# Patient Record
Sex: Female | Born: 1970 | Race: Black or African American | Hispanic: No | Marital: Married | Smoking: Never smoker
Health system: Southern US, Community
[De-identification: ages and names within clinical notes are randomized; demographics above are authoritative.]

## PROBLEM LIST (undated history)

## (undated) ENCOUNTER — Ambulatory Visit: Admission: EM | Payer: Self-pay | Source: Home / Self Care

## (undated) DIAGNOSIS — R600 Localized edema: Secondary | ICD-10-CM

## (undated) DIAGNOSIS — M25569 Pain in unspecified knee: Secondary | ICD-10-CM

## (undated) DIAGNOSIS — F1120 Opioid dependence, uncomplicated: Secondary | ICD-10-CM

## (undated) DIAGNOSIS — K219 Gastro-esophageal reflux disease without esophagitis: Secondary | ICD-10-CM

## (undated) DIAGNOSIS — I10 Essential (primary) hypertension: Secondary | ICD-10-CM

## (undated) DIAGNOSIS — S8000XA Contusion of unspecified knee, initial encounter: Secondary | ICD-10-CM

## (undated) DIAGNOSIS — K8000 Calculus of gallbladder with acute cholecystitis without obstruction: Secondary | ICD-10-CM

## (undated) DIAGNOSIS — T50905A Adverse effect of unspecified drugs, medicaments and biological substances, initial encounter: Secondary | ICD-10-CM

## (undated) DIAGNOSIS — F1111 Opioid abuse, in remission: Secondary | ICD-10-CM

## (undated) DIAGNOSIS — K259 Gastric ulcer, unspecified as acute or chronic, without hemorrhage or perforation: Secondary | ICD-10-CM

## (undated) HISTORY — DX: Calculus of gallbladder with acute cholecystitis without obstruction: K80.00

## (undated) HISTORY — PX: BREAST REDUCTION SURGERY: SHX8

## (undated) HISTORY — DX: Essential (primary) hypertension: I10

## (undated) HISTORY — DX: Pain in unspecified knee: M25.569

## (undated) HISTORY — PX: REDUCTION MAMMAPLASTY: SUR839

## (undated) HISTORY — DX: Contusion of unspecified knee, initial encounter: S80.00XA

## (undated) HISTORY — DX: Localized edema: R60.0

## (undated) HISTORY — DX: Opioid abuse, in remission: F11.11

## (undated) HISTORY — PX: BREAST BIOPSY: SHX20

---

## 2007-01-15 ENCOUNTER — Ambulatory Visit: Payer: Self-pay | Admitting: Emergency Medicine

## 2007-01-22 ENCOUNTER — Ambulatory Visit: Payer: Self-pay | Admitting: Internal Medicine

## 2007-07-12 ENCOUNTER — Ambulatory Visit: Payer: Self-pay | Admitting: Internal Medicine

## 2007-12-23 ENCOUNTER — Ambulatory Visit: Payer: Self-pay | Admitting: Internal Medicine

## 2008-01-23 ENCOUNTER — Ambulatory Visit: Payer: Self-pay | Admitting: Internal Medicine

## 2008-02-24 ENCOUNTER — Emergency Department: Payer: Self-pay | Admitting: Emergency Medicine

## 2008-07-04 ENCOUNTER — Ambulatory Visit: Payer: Self-pay | Admitting: Internal Medicine

## 2008-10-26 ENCOUNTER — Ambulatory Visit: Payer: Self-pay | Admitting: Internal Medicine

## 2008-10-31 ENCOUNTER — Emergency Department: Payer: Self-pay | Admitting: Internal Medicine

## 2008-11-03 ENCOUNTER — Emergency Department: Payer: Self-pay | Admitting: Emergency Medicine

## 2008-11-04 ENCOUNTER — Ambulatory Visit: Payer: Self-pay | Admitting: Family Medicine

## 2008-11-25 ENCOUNTER — Ambulatory Visit: Payer: Self-pay | Admitting: Internal Medicine

## 2008-12-21 ENCOUNTER — Ambulatory Visit: Payer: Self-pay | Admitting: Internal Medicine

## 2009-02-13 ENCOUNTER — Ambulatory Visit: Payer: Self-pay | Admitting: Family Medicine

## 2012-10-01 ENCOUNTER — Ambulatory Visit: Payer: Self-pay | Admitting: Internal Medicine

## 2012-10-04 ENCOUNTER — Inpatient Hospital Stay: Payer: Self-pay | Admitting: Obstetrics and Gynecology

## 2012-10-04 ENCOUNTER — Ambulatory Visit: Payer: Self-pay

## 2012-10-04 LAB — COMPREHENSIVE METABOLIC PANEL
Alkaline Phosphatase: 70 U/L (ref 50–136)
BUN: 11 mg/dL (ref 7–18)
Chloride: 99 mmol/L (ref 98–107)
Co2: 25 mmol/L (ref 21–32)
Creatinine: 0.83 mg/dL (ref 0.60–1.30)
EGFR (African American): >60
Glucose: 121 mg/dL — ABNORMAL HIGH (ref 65–99)
Osmolality: 271 (ref 275–301)
Potassium: 4.1 mmol/L (ref 3.5–5.1)
SGOT(AST): 19 U/L (ref 15–37)
Sodium: 135 mmol/L — ABNORMAL LOW (ref 136–145)

## 2012-10-04 LAB — CBC WITH DIFFERENTIAL/PLATELET
Basophil #: 0.1 10*3/uL (ref 0.0–0.1)
Basophil %: 0.6 %
Eosinophil #: 0 10*3/uL (ref 0.0–0.7)
HCT: 17.2 % — ABNORMAL LOW (ref 35.0–47.0)
HGB: 4.9 g/dL — CL (ref 12.0–16.0)
Lymphocyte %: 8.5 %
MCHC: 28.4 g/dL — ABNORMAL LOW (ref 32.0–36.0)
MCV: 59 fL — ABNORMAL LOW (ref 80–100)
Monocyte #: 0.3 x10 3/mm (ref 0.2–0.9)
Monocyte %: 3.5 %
RBC: 2.9 10*6/uL — ABNORMAL LOW (ref 3.80–5.20)
RDW: 21.3 % — ABNORMAL HIGH (ref 11.5–14.5)
WBC: 9.1 10*3/uL (ref 3.6–11.0)

## 2012-10-05 LAB — DRUG SCREEN, URINE
Amphetamines, Ur Screen: NEGATIVE (ref ?–1000)
Benzodiazepine, Ur Scrn: NEGATIVE (ref ?–200)
Cannabinoid 50 Ng, Ur ~~LOC~~: NEGATIVE (ref ?–50)
MDMA (Ecstasy)Ur Screen: NEGATIVE (ref ?–500)
Methadone, Ur Screen: NEGATIVE (ref ?–300)
Opiate, Ur Screen: POSITIVE (ref ?–300)
Phencyclidine (PCP) Ur S: NEGATIVE (ref ?–25)
Tricyclic, Ur Screen: NEGATIVE (ref ?–1000)

## 2012-10-05 LAB — CBC WITH DIFFERENTIAL/PLATELET
Basophil #: 0.1 10*3/uL (ref 0.0–0.1)
Eosinophil #: 0 10*3/uL (ref 0.0–0.7)
Eosinophil #: 0.1 10*3/uL (ref 0.0–0.7)
Eosinophil %: 0.3 %
HCT: 20.2 % — ABNORMAL LOW (ref 35.0–47.0)
HCT: 27.5 % — ABNORMAL LOW (ref 35.0–47.0)
HGB: 6.3 g/dL — ABNORMAL LOW (ref 12.0–16.0)
HGB: 9.1 g/dL — ABNORMAL LOW (ref 12.0–16.0)
Lymphocyte #: 1.3 10*3/uL (ref 1.0–3.6)
Lymphocyte #: 1.9 10*3/uL (ref 1.0–3.6)
Lymphocyte %: 11.9 %
Lymphocyte %: 18.1 %
MCH: 21.2 pg — ABNORMAL LOW (ref 26.0–34.0)
MCHC: 31.5 g/dL — ABNORMAL LOW (ref 32.0–36.0)
MCV: 67 fL — ABNORMAL LOW (ref 80–100)
Monocyte %: 5.8 %
Monocyte %: 5 %
Neutrophil #: 9.1 10*3/uL — ABNORMAL HIGH (ref 1.4–6.5)
Neutrophil #: 8.1 10*3/uL — ABNORMAL HIGH (ref 1.4–6.5)
Neutrophil %: 75.4 %
Platelet: 343 10*3/uL (ref 150–440)
Platelet: 326 10*3/uL (ref 150–440)
RBC: 2.99 10*6/uL — ABNORMAL LOW (ref 3.80–5.20)
RBC: 3.83 10*6/uL (ref 3.80–5.20)
RDW: 30 % — ABNORMAL HIGH (ref 11.5–14.5)
WBC: 11.2 10*3/uL — ABNORMAL HIGH (ref 3.6–11.0)
WBC: 10.7 10*3/uL (ref 3.6–11.0)

## 2012-10-06 LAB — CBC
HGB: 8.1 g/dL — ABNORMAL LOW (ref 12.0–16.0)
MCHC: 32.3 g/dL (ref 32.0–36.0)
MCV: 72 fL — ABNORMAL LOW (ref 80–100)
Platelet: 246 10*3/uL (ref 150–440)
RBC: 3.49 10*6/uL — ABNORMAL LOW (ref 3.80–5.20)
WBC: 11.7 10*3/uL — ABNORMAL HIGH (ref 3.6–11.0)

## 2012-10-06 LAB — COMPREHENSIVE METABOLIC PANEL
Alkaline Phosphatase: 54 U/L (ref 50–136)
BUN: 10 mg/dL (ref 7–18)
Bilirubin,Total: 0.3 mg/dL (ref 0.2–1.0)
Calcium, Total: 8.5 mg/dL (ref 8.5–10.1)
Co2: 28 mmol/L (ref 21–32)
Creatinine: 0.92 mg/dL (ref 0.60–1.30)
EGFR (Non-African Amer.): >60
Glucose: 94 mg/dL (ref 65–99)
Osmolality: 269 (ref 275–301)
Potassium: 4.2 mmol/L (ref 3.5–5.1)
Sodium: 135 mmol/L — ABNORMAL LOW (ref 136–145)
Total Protein: 7.2 g/dL (ref 6.4–8.2)

## 2012-10-06 LAB — PROTIME-INR: Prothrombin Time: 15.5 secs — ABNORMAL HIGH (ref 11.5–14.7)

## 2012-10-16 ENCOUNTER — Ambulatory Visit: Payer: Self-pay | Admitting: Obstetrics and Gynecology

## 2012-10-16 LAB — CBC
HCT: 29.2 % — ABNORMAL LOW (ref 35.0–47.0)
HGB: 9.2 g/dL — ABNORMAL LOW (ref 12.0–16.0)
MCH: 23.1 pg — ABNORMAL LOW (ref 26.0–34.0)
MCHC: 31.4 g/dL — ABNORMAL LOW (ref 32.0–36.0)
RBC: 3.98 10*6/uL (ref 3.80–5.20)
RDW: 26.9 % — ABNORMAL HIGH (ref 11.5–14.5)
WBC: 6.6 10*3/uL (ref 3.6–11.0)

## 2012-10-27 ENCOUNTER — Ambulatory Visit: Payer: Self-pay | Admitting: Obstetrics and Gynecology

## 2012-10-30 LAB — PATHOLOGY REPORT

## 2013-05-29 ENCOUNTER — Ambulatory Visit: Payer: Self-pay

## 2013-05-29 LAB — CBC WITH DIFFERENTIAL/PLATELET
BASOS ABS: 0.1 10*3/uL (ref 0.0–0.1)
BASOS PCT: 0.8 %
EOS PCT: 1 %
Eosinophil #: 0.1 10*3/uL (ref 0.0–0.7)
HCT: 32.3 % — ABNORMAL LOW (ref 35.0–47.0)
HGB: 9.8 g/dL — ABNORMAL LOW (ref 12.0–16.0)
LYMPHS PCT: 15.6 %
Lymphocyte #: 1.2 10*3/uL (ref 1.0–3.6)
MCH: 21.1 pg — AB (ref 26.0–34.0)
MCHC: 30.3 g/dL — ABNORMAL LOW (ref 32.0–36.0)
MCV: 70 fL — AB (ref 80–100)
Monocyte #: 0.7 x10 3/mm (ref 0.2–0.9)
Monocyte %: 9.2 %
Neutrophil #: 5.5 10*3/uL (ref 1.4–6.5)
Neutrophil %: 73.4 %
PLATELETS: 465 10*3/uL — AB (ref 150–440)
RBC: 4.64 10*6/uL (ref 3.80–5.20)
RDW: 20.3 % — AB (ref 11.5–14.5)
WBC: 7.4 10*3/uL (ref 3.6–11.0)

## 2013-05-29 LAB — COMPREHENSIVE METABOLIC PANEL
ALT: 20 U/L (ref 12–78)
Albumin: 3.9 g/dL (ref 3.4–5.0)
Alkaline Phosphatase: 72 U/L
Anion Gap: 10 (ref 7–16)
BUN: 8 mg/dL (ref 7–18)
Bilirubin,Total: 0.3 mg/dL (ref 0.2–1.0)
Calcium, Total: 9.7 mg/dL (ref 8.5–10.1)
Chloride: 99 mmol/L (ref 98–107)
Co2: 27 mmol/L (ref 21–32)
Creatinine: 0.99 mg/dL (ref 0.60–1.30)
EGFR (Non-African Amer.): >60
GLUCOSE: 89 mg/dL (ref 65–99)
OSMOLALITY: 270 (ref 275–301)
Potassium: 4 mmol/L (ref 3.5–5.1)
SGOT(AST): 22 U/L (ref 15–37)
SODIUM: 136 mmol/L (ref 136–145)
TOTAL PROTEIN: 8.9 g/dL — AB (ref 6.4–8.2)

## 2013-05-29 LAB — RAPID INFLUENZA A&B ANTIGENS (ARMC ONLY)

## 2014-08-30 NOTE — Op Note (Signed)
PATIENT NAME:  Sherri Banks, Sherri Banks MR#:  [id number] DATE OF BIRTH:  06/30/1970  DATE OF PROCEDURE:  10/27/2012  PREOPERATIVE DIAGNOSES: 1.  Menorrhagia to anemia requiring transfusion.  2.  Thickened endometrial lining at 3 cm.  3.  Cervical stenosis, inability to biopsy in the office secondary to patient discomfort.   POSTOPERATIVE DIAGNOSES: 1.  Menorrhagia to anemia requiring transfusion.  2.  Thickened endometrial lining at 3 cm.  3.  Cervical stenosis, inability to biopsy in the office secondary to patient discomfort.   OPERATION PERFORMED: Hysteroscopy, dilatation and curettage, and Mirena IUD insertion.   ANESTHESIA USED: General via LMA.  PRIMARY SURGEON: Florina Oundreas M. Bonney AidStaebler, MD    INTRAOPERATIVE FINDINGS: Normal-appearing cervix. The endometrium did appear fluffy and thickened; however, there were no discrete polyps visualized. Both tubal ostia were seen and appeared grossly normal. The dilatation and curettage portion of the case yielded a moderate amount of pink-tan tissue. The uterus sounded to a length of 10 cm and the Mirena IUD deployed without difficulty.   SPECIMENS REMOVED: Endometrial curettings.   PATIENT CONDITION FOLLOWING PROCEDURE: Stable.   PROCEDURE IN DETAIL: Risks, benefits and alternatives of the procedure were discussed with the patient prior to proceeding to the Operating Room. The patient was taken to the Operating Room. She was placed under general anesthesia. She was positioned in the dorsal lithotomy position using candy cane stirrups. The patient was prepped and draped in the usual sterile fashion. A timeout procedure was then performed. An operative speculum was used to visualize the cervix, which was grasped with a single-tooth tenaculum. The cervix was then sequentially dilated using Pratt dilators. The hysteroscope was then advanced into the uterine cavity, noting the above findings. Following the hysteroscopic portion of the case, curettage was performed,  yielding a moderate amount of tissue with good uterine cry noted throughout following the procedure. The hysteroscope was readvanced and the uterus was reinspected with no additional findings noted. The Mirena IUD was then placed after sounding the uterus to 10 cm. The IUD deployed without difficulty. Following placement of the IUD, the IUD strings were trimmed. The single-tooth tenaculum was removed, as was the speculum. The tenaculum sites and cervical os were noted to be hemostatic. Sponge, needle and instrument counts were correct x 2. The patient tolerated the procedure well and was taken to the recovery room in stable condition.    ____________________________ Florina OuAndreas M. Bonney AidStaebler, MD ams:jm D: 10/27/2012 13:25:48 ET T: 10/27/2012 14:00:27 ET JOB#: [id number]  cc: Florina OuAndreas M. Bonney AidStaebler, MD, <Dictator> Carmel SacramentoANDREAS Cathrine MusterM Liahm Grivas MD ELECTRONICALLY SIGNED 10/31/2012 7:26

## 2014-08-30 NOTE — Consult Note (Signed)
Consulting Department: Emergency Department Consulting Physician: Suzzanne Cloud MD  Consulting Question: menonrrhagia to anemia  History of Present Illness: 44 yo G1P1001 presenting with a 3 month history of regular monthly, but increasingly heavy menses.  Menstrual intervals remain monthly but have increased in duration from 4-5 days to 7-8 days with passage of clots.  The patient states she went to urgent care on Sunday with complaints of dizzyness, at that time was noted to display elevated BP 180 systolics unsure of diastolic number, and was started on Triamterene 37.5mg /25mg .  She started this current menses at 17:00 on 10/03/12.  States she has been going through a pad an hour and passing large clots.  Associated symptoms include dysmenorrhea, lightheadedness, SOB with exertion.  The patient does have a prior history of menorrhagia preceeding the birth of her son in 2010.  She has undergone diagnostic laparoscopy at [facility] and was told her uterus had an unusual shape although it is unclear if this was secondary to fibroids or a mullerian abnormality.  No history of other bleeding abnormalities or problems with prior surgeries or dental work, no history of easy bruising.   denies HA, nipple discharge, reports some intentional weightloss over the past year, no temeperature intolerance, palpitations (prior to this bleeding episode), constipation, diarrhea, skin or hair changes.  Past Medical History:Benign essential hypertensionDepression Surgical History:C-section 2010 Diagnostic laparoscopyBreast reduction  Past Obstetric History: G1P1001, C-section 2010 at term for nonr-reassuring fetal surveillance.  No pregnancy complications. History: remote history of abnormal pap, last pap with 2010 pregnancy normal.  No history of STI.  Family History: Non-contributory  Social History: No tobacco, EtOH, or illicit drug use  Allergies: Ibuprofen - gastric ulcer  Medications:Triamterene  37.5mg /25mg Effexor  po bid  Physical Exam:Afebrile BP 135/78; HR 113; RR 16; O2sat 100%NADnormocephalic, anicteric, conjuctiva pale, mucous mebranes moist, no thyromegally or nodulesCTABtachycardic, sytolic ejection murmur 3/6NABS, soft, non-tender, non-distended, 15 week size fibroid uterus appreciatedPad with moderate amount of bleeding, had been in place for 1-hr2+ dorsalis pedis pulses bilaterally, no edemano rashes or lesions  Laboratory:reviewed normalWBC 9.1, H&H 4.9 & 17.2, platelets 407K<1O positive, negative ABSCsinus rythm, possible left atrial enlargement  Assessment: 44 yo G1P1001 with menorrhagia to anemia requiring transfusion  Plan:Menorrhagia     - admit to women's unit     - crossmatch and transfuse 2 units     - TVUS pending suspect leiomyomata as underlying etiology     - Will recheck CBC in AM, also obtain TSH     - Depending on US findings and endometrial lining will proceed with high dose progestin or IV estrogen.  Given age and HTN        favor former unless lining appears excessively thinned     - Longterm managment pending US findings as well an may include progestin therpay/Mirena, ablation, [place], or hysterectomy HTN/flow murmur     - will hold antihypertensive during acute bleeding epsiode     - will see i flow murmur resolved post transfusion, given left atrial enlargement on EKG consider cardiology consult at risk given uncontrolled HTN with no regular medical care over the past few years. FEN     - NPO should bleeding not resolve with medical managment and she need emergent D&C     - D5 1/2NS at 153ml/hr DVT ppx - SCD's Activity - bedrest Disposition pending cessation of bleeding and stable Hgb, likely tomorrow.  Outpatient follow up to discuss longterm managment        Electronic  Signatures: Sherri Banks, Sherri Banks (MD) (Signed on 28-May-14 20:44)  Authored   Last Updated: 29-May-14 02:13 by Sherri Banks, Sherri Banks (MD)

## 2014-08-30 NOTE — Consult Note (Signed)
Brief Consult Note: Diagnosis: Menorrhagia to anemia requiring transfusion.   Patient was seen by consultant.   Consult note dictated.   Recommend further assessment or treatment.   Orders entered.   Comments: 44 yo G1P1001, 3 month history of menorrhagia, now symptomatic acute blood loss anemia 1) Menorrhagia      - TVUS to asses for structural abnormalities.  On abdominal exam fibroid uterus appreciated 15 wk size     - pending US results and assesment of endometrium will proceed with high dose progestin vs IV estrogen.  Pros and cons of each discussed including increased risk of thromboembolic events in someone her age and HTN.  If indeed secondary to fibroids discussed managment options including [place], Mirena, or hysterectomy.  Given size likely precludes endometrial ablation, in addition patient with uterine structural abnormality per prior diagnosit laparoscopy at [facility] for menorrhagia (bicornuate?)     - transfuse 2U pRBC recheck CBC in AM.  Electronic Signatures: Sherri Banks, Sherri Banks (MD)  (Signed 28-May-14 20:05)  Authored: Brief Consult Note   Last Updated: 28-May-14 20:05 by Sherri Banks, Sherri Banks (MD)

## 2014-11-18 ENCOUNTER — Encounter: Payer: Self-pay | Admitting: Emergency Medicine

## 2014-11-18 ENCOUNTER — Ambulatory Visit
Admission: EM | Admit: 2014-11-18 | Discharge: 2014-11-18 | Disposition: A | Discharge status: Home / Self Care | Payer: BLUE CROSS/BLUE SHIELD | Attending: Internal Medicine | Admitting: Internal Medicine

## 2014-11-18 DIAGNOSIS — R51 Headache: Secondary | ICD-10-CM | POA: Diagnosis present

## 2014-11-18 DIAGNOSIS — B349 Viral infection, unspecified: Secondary | ICD-10-CM | POA: Diagnosis not present

## 2014-11-18 DIAGNOSIS — J029 Acute pharyngitis, unspecified: Secondary | ICD-10-CM | POA: Diagnosis present

## 2014-11-18 LAB — RAPID STREP SCREEN (MED CTR MEBANE ONLY): Streptococcus, Group A Screen (Direct): NEGATIVE

## 2014-11-18 MED ORDER — VENLAFAXINE HCL ER 150 MG PO CP24: 300.0000 mg | ORAL_CAPSULE | Freq: Every day | ORAL | Status: DC

## 2014-11-18 NOTE — ED Notes (Signed)
Patient c/o sore throat, HAs and congestion since last Thursday.

## 2014-11-18 NOTE — Discharge Instructions (Signed)
Strep swab at the urgent care today was negative for strep; a throat culture is pending. Push fluids, rest.  Ibuprofen or aleve may be helpful for throat pain.   Note for work today and tomorrow. Need to find a new primary care provider to establish care; a two-week refill for effexor was given today. [facility] [phone]) may be a good resource for you.  Pharyngitis Pharyngitis is redness, pain, and swelling (inflammation) of your pharynx.  CAUSES  Pharyngitis is usually caused by infection. Most of the time, these infections are from viruses (viral) and are part of a cold. However, sometimes pharyngitis is caused by bacteria (bacterial). Pharyngitis can also be caused by allergies. Viral pharyngitis may be spread from person to person by coughing, sneezing, and personal items or utensils (cups, forks, spoons, toothbrushes). Bacterial pharyngitis may be spread from person to person by more intimate contact, such as kissing.  SIGNS AND SYMPTOMS  Symptoms of pharyngitis include:   Sore throat.   Tiredness (fatigue).   Low-grade fever.   Headache.  Joint pain and muscle aches.  Skin rashes.  Swollen lymph nodes.  Plaque-like film on throat or tonsils (often seen with bacterial pharyngitis). DIAGNOSIS  Your health care provider will ask you questions about your illness and your symptoms. Your medical history, along with a physical exam, is often all that is needed to diagnose pharyngitis. Sometimes, a rapid strep test is done. Other lab tests may also be done, depending on the suspected cause.  TREATMENT  Viral pharyngitis will usually get better in 3-4 days without the use of medicine. Bacterial pharyngitis is treated with medicines that kill germs (antibiotics).  HOME CARE INSTRUCTIONS   Drink enough water and fluids to keep your urine clear or pale yellow.   Only take over-the-counter or prescription medicines as directed by your  health care provider:   If you are prescribed antibiotics, make sure you finish them even if you start to feel better.   Do not take aspirin.   Get lots of rest.   Gargle with 8 oz of salt water ( tsp of salt per 1 qt of water) as often as every 1-2 hours to soothe your throat.   Throat lozenges (if you are not at risk for choking) or sprays may be used to soothe your throat. SEEK MEDICAL CARE IF:   You have large, tender lumps in your neck.  You have a rash.  You cough up green, yellow-brown, or bloody spit. SEEK IMMEDIATE MEDICAL CARE IF:   Your neck becomes stiff.  You drool or are unable to swallow liquids.  You vomit or are unable to keep medicines or liquids down.  You have severe pain that does not go away with the use of recommended medicines.  You have trouble breathing (not caused by a stuffy nose). MAKE SURE YOU:   Understand these instructions.  Will watch your condition.  Will get help right away if you are not doing well or get worse. Document Released: 04/26/2005 Document Revised: 02/14/2013 Document Reviewed: 01/01/2013 [facility] Patient Information 2015 [employer] [place]. This information is not intended to replace advice given to you by your health care provider. Make sure you discuss any questions you have with your health care provider.

## 2014-11-18 NOTE — ED Provider Notes (Signed)
CSN: [id number]     Arrival date & time 11/18/14  1145 History   First MD Initiated Contact with Patient 11/18/14 1226     Chief Complaint  Patient presents with  . Sore Throat   Patient is a 44 y.o. female presenting with pharyngitis.  Sore Throat   Patient is a 44 year old lady with past medical history notable for hypertension. Her previous PCP was [facility], but they're no longer offering primary care. She started with a sore throat about 4 days ago, not much cough, not much runny nose. She does feel little congested. Tactile temperatures, with headache and bad body aches. Appetite is poor, but no vomiting, no diarrhea. No known sick contacts. Her six-year-old son is feeling fine.   History reviewed. No pertinent past medical history. Past Surgical History  Procedure Laterality Date  . Cesarean section     History reviewed. No pertinent family history. History  Substance Use Topics  . Smoking status: Never Smoker   . Smokeless tobacco: Never Used  . Alcohol Use: No    Review of Systems  All other systems reviewed and are negative.   Allergies  Review of patient's allergies indicates no known allergies.  Home Medications   Prior to Admission medications   Medication Sig Start Date End Date Taking? Authorizing Provider  venlafaxine XR (EFFEXOR XR) 150 MG 24 hr capsule Take 2 capsules (300 mg total) by mouth daily with breakfast. 11/18/14 12/02/14  Eustace Moore, MD   BP 122/79 mmHg  Pulse 104  Temp(Src) 98.6 F (37 C) (Tympanic)  Resp 16  Ht  (1.676 m)  Wt 215 lb (97.523 kg)  BMI 34.72 kg/m2  SpO2 99% Physical Exam  Constitutional: She is oriented to person, place, and time.  Alert, nicely groomed Looks ill but not toxic. Able to sit up independently bedside chair  HENT:  Head: Atraumatic.  Right TM is quite dull, no erythema; left TM is mildly dull and flushed pink. Marked nasal congestion Throat is difficult to visualize, but appears red  Eyes:   Conjugate gaze, no eye redness/drainage  Neck: Neck supple.  Cardiovascular: Normal rate and regular rhythm.   Slightly tachycardic, 110s on exam  Pulmonary/Chest: No respiratory distress. She has no wheezes. She has no rales.  Lungs clear, symmetric breath sounds  Abdominal: She exhibits no distension.  Musculoskeletal: Normal range of motion.  No leg swelling  Neurological: She is alert and oriented to person, place, and time.  Skin: Skin is warm and dry.  No cyanosis  Nursing note and vitals reviewed.   ED Course  Procedures   Results for orders placed or performed during the hospital encounter of 11/18/14  Rapid strep screen  Result Value Ref Range   Streptococcus, Group A Screen (Direct) NEGATIVE NEGATIVE  Culture, group A strep ([facility] only)  Result Value Ref Range   Specimen Description THROAT    Special Requests NONE    Culture      NO BETA HEMOLYTIC STREPTOCOCCI ISOLATED IN 48 HOURS   Report Status 11/21/2014 FINAL    Throat cx pending.  MDM   1. Pharyngitis with viral syndrome   Strep swab at the urgent care today was negative for strep; a throat culture is pending. Push fluids, rest.  Ibuprofen or aleve may be helpful for throat pain.   Note for work today and tomorrow. Need to find a new primary care provider to establish care; a two-week refill for effexor was given today. [place]  [facility] [phone]) may be a good resource.     Eustace MooreLaura W Daliyah Sramek, MD 11/23/14 [phone]

## 2014-11-21 LAB — CULTURE, GROUP A STREP (THRC)

## 2015-06-12 DIAGNOSIS — R7309 Other abnormal glucose: Secondary | ICD-10-CM | POA: Insufficient documentation

## 2015-06-12 DIAGNOSIS — IMO0001 Reserved for inherently not codable concepts without codable children: Secondary | ICD-10-CM | POA: Insufficient documentation

## 2015-06-12 DIAGNOSIS — R600 Localized edema: Secondary | ICD-10-CM

## 2015-06-12 DIAGNOSIS — I10 Essential (primary) hypertension: Secondary | ICD-10-CM

## 2015-06-12 DIAGNOSIS — R030 Elevated blood-pressure reading, without diagnosis of hypertension: Secondary | ICD-10-CM

## 2015-06-12 HISTORY — DX: Essential (primary) hypertension: I10

## 2015-06-12 HISTORY — DX: Localized edema: R60.0

## 2016-02-21 ENCOUNTER — Ambulatory Visit
Admission: EM | Admit: 2016-02-21 | Discharge: 2016-02-21 | Disposition: A | Discharge status: Home / Self Care | Payer: 59 | Attending: Family Medicine | Admitting: Family Medicine

## 2016-02-21 DIAGNOSIS — M7989 Other specified soft tissue disorders: Secondary | ICD-10-CM | POA: Diagnosis not present

## 2016-02-21 DIAGNOSIS — R609 Edema, unspecified: Secondary | ICD-10-CM

## 2016-02-21 HISTORY — DX: Essential (primary) hypertension: I10

## 2016-02-21 LAB — BASIC METABOLIC PANEL
Anion gap: 6 (ref 5–15)
BUN: 15 mg/dL (ref 6–20)
CO2: 30 mmol/L (ref 22–32)
CREATININE: 0.9 mg/dL (ref 0.44–1.00)
Calcium: 9.4 mg/dL (ref 8.9–10.3)
Chloride: 101 mmol/L (ref 101–111)
GFR calc Af Amer: >60 mL/min (ref >60)
GFR calc non Af Amer: >60 mL/min (ref >60)
Glucose, Bld: 89 mg/dL (ref 65–99)
POTASSIUM: 3.9 mmol/L (ref 3.5–5.1)
Sodium: 137 mmol/L (ref 135–145)

## 2016-02-21 MED ORDER — TRIAMTERENE-HCTZ 37.5-25 MG PO TABS: 1.0000 | ORAL_TABLET | Freq: Every day | ORAL | 0 refills | Status: AC

## 2016-02-21 NOTE — ED Triage Notes (Signed)
Patient complains of bilateral edema on lower leg. Patient states that her primary doctor has left the practice she was with. Patient states that she could not establish with a new primary doctor at the clinic til November. Patient states that she has been controlled on her Furosemide and Triamterene. Patient states that she has been out of medication for the last 3 weeks.

## 2016-02-21 NOTE — ED Provider Notes (Signed)
[facility]    CSN: [id number] Arrival date & time: 02/21/16  1441     History   Chief Complaint Chief Complaint  Patient presents with  . Leg Swelling    HPI Sherri Banks is a 45 y.o. female.   45 yo female with a h/o hypertension and chronic dependent lower extremity edema presents with a c/o leg swelling and needing refill on her diuretics. States she is changing PCPs and has run out of her diuretics. States appointment with new PCP is in 1 month. Denies any chest pains or shortness of breath.    The history is provided by the patient.    Past Medical History:  Diagnosis Date  . Hypertension     There are no active problems to display for this patient.   Past Surgical History:  Procedure Laterality Date  . BREAST REDUCTION SURGERY    . CESAREAN SECTION      OB History    No data available       Home Medications    Prior to Admission medications   Medication Sig Start Date End Date Taking? Authorizing Provider  amLODipine (NORVASC) 5 MG tablet Take 5 mg by mouth daily.   Yes Historical Provider, MD  buprenorphine (SUBUTEX) 2 MG SUBL SL tablet Place under the tongue daily.   Yes Historical Provider, MD  triamterene-hydrochlorothiazide (MAXZIDE-25) 37.5-25 MG tablet Take 1 tablet by mouth daily. 02/21/16   Sherri Mccallumrlando Hetal Proano, MD  venlafaxine XR (EFFEXOR XR) 150 MG 24 hr capsule Take 2 capsules (300 mg total) by mouth daily with breakfast. 11/18/14 12/02/14  Eustace MooreLaura W Murray, MD    Family History History reviewed. No pertinent family history.  Social History Social History  Substance Use Topics  . Smoking status: Never Smoker  . Smokeless tobacco: Never Used  . Alcohol use No     Allergies   Ciprofloxacin   Review of Systems Review of Systems   Physical Exam Triage Vital Signs ED Triage Vitals  Enc Vitals Group     BP 02/21/16 1516 120/80     Pulse Rate 02/21/16 1516 93     Resp 02/21/16 1516 17     Temp 02/21/16 1516 98.2 F (36.8  C)     Temp Source 02/21/16 1516 Oral     SpO2 02/21/16 1516 100 %     Weight 02/21/16 1515 215 lb (97.5 kg)     Height 02/21/16 1515 5\' 6"  (1.676 m)     Head Circumference --      Peak Flow --      Pain Score 02/21/16 1518 8     Pain Loc --      Pain Edu? --      Excl. in GC? --    No data found.   Updated Vital Signs BP 120/80 (BP Location: Left Arm)   Pulse 93   Temp 98.2 F (36.8 C) (Oral)   Resp 17   Ht 5\' 6"  (1.676 m)   Wt 215 lb (97.5 kg)   SpO2 100%   BMI 34.70 kg/m   Visual Acuity Right Eye Distance:   Left Eye Distance:   Bilateral Distance:    Right Eye Near:   Left Eye Near:    Bilateral Near:     Physical Exam  Constitutional: She appears well-developed and well-nourished. No distress.  HENT:  Head: Normocephalic and atraumatic.  Right Ear: Tympanic membrane, external ear and ear canal normal.  Left Ear: Tympanic membrane, external  ear and ear canal normal.  Nose: Mucosal edema and rhinorrhea present. No nose lacerations, sinus tenderness, nasal deformity, septal deviation or nasal septal hematoma. No epistaxis.  No foreign bodies. Right sinus exhibits maxillary sinus tenderness and frontal sinus tenderness. Left sinus exhibits maxillary sinus tenderness and frontal sinus tenderness.  Mouth/Throat: Uvula is midline, oropharynx is clear and moist and mucous membranes are normal. No oropharyngeal exudate.  Eyes: Conjunctivae and EOM are normal. Pupils are equal, round, and reactive to light. Right eye exhibits no discharge. Left eye exhibits no discharge. No scleral icterus.  Neck: Normal range of motion. Neck supple. No thyromegaly present.  Cardiovascular: Normal rate, regular rhythm, normal heart sounds and intact distal pulses.   Pulmonary/Chest: Effort normal and breath sounds normal. No respiratory distress. She has no wheezes. She has no rales.  Musculoskeletal: She exhibits edema.  Bilateral 1+ tibial and pedal pitting edema  Lymphadenopathy:     She has no cervical adenopathy.  Skin: She is not diaphoretic.  Nursing note and vitals reviewed.    UC Treatments / Results  Labs (all labs ordered are listed, but only abnormal results are displayed) Labs Reviewed  BASIC METABOLIC PANEL    EKG  EKG Interpretation None       Radiology No results found.  Procedures Procedures (including critical care time)  Medications Ordered in UC Medications - No data to display   Initial Impression / Assessment and Plan / UC Course  I have reviewed the triage vital signs and the nursing notes.  Pertinent labs & imaging results that were available during my care of the patient were reviewed by me and considered in my medical decision making (see chart for details).  Clinical Course      Final Clinical Impressions(s) / UC Diagnoses   Final diagnoses:  Peripheral edema  Leg swelling    New Prescriptions Discharge Medication List as of 02/21/2016  4:56 PM    START taking these medications   Details  triamterene-hydrochlorothiazide (MAXZIDE-25) 37.5-25 MG tablet Take 1 tablet by mouth daily., Starting Sat 02/21/2016, Normal       1. Lab results and diagnosis reviewed with patient 2. rx as per orders above; reviewed possible side effects, interactions, risks and benefits; refilled patient's Maxzide for one month supply only as per orders; discussed with patient that further refills through her PCP 3. Recommend supportive treatment with low sodium diet, compression stockings 4. Follow-up with PCP as scheduled next month to establish care and continued management   Sherri Mccallum, MD 02/21/16 1724

## 2016-02-23 ENCOUNTER — Telehealth: Payer: Self-pay | Admitting: *Deleted

## 2016-02-23 NOTE — Telephone Encounter (Signed)
Patient returned courtesy call. Patient reports feeling better. Advised patient to follow up with PCP or [facility] if symptoms return.

## 2016-02-23 NOTE — Telephone Encounter (Signed)
Courtesy follow up call, no answer, unable to leave message due to voicemail unavailability.

## 2016-06-24 DIAGNOSIS — F1111 Opioid abuse, in remission: Secondary | ICD-10-CM

## 2016-06-24 HISTORY — DX: Opioid abuse, in remission: F11.11

## 2016-08-09 ENCOUNTER — Ambulatory Visit: Admission: EM | Admit: 2016-08-09 | Discharge: 2016-08-09 | Discharge status: Absent without leave | Payer: 59

## 2016-08-09 ENCOUNTER — Ambulatory Visit
Admission: EM | Admit: 2016-08-09 | Discharge: 2016-08-09 | Disposition: A | Discharge status: Home / Self Care | Payer: Worker's Compensation | Attending: Family Medicine | Admitting: Family Medicine

## 2016-08-09 ENCOUNTER — Encounter: Payer: Self-pay | Admitting: *Deleted

## 2016-08-09 DIAGNOSIS — M25562 Pain in left knee: Secondary | ICD-10-CM | POA: Diagnosis not present

## 2016-08-09 DIAGNOSIS — S8002XA Contusion of left knee, initial encounter: Secondary | ICD-10-CM

## 2016-08-09 NOTE — Discharge Instructions (Signed)
Patient declined follow-up Patient declines any extra medication such as Mobic she just wants to take her Naprosyn Patient declines x-ray of her left knee as well

## 2016-08-09 NOTE — ED Triage Notes (Signed)
Patient injured her left knee when she tripped and fell at work. Today.

## 2016-08-09 NOTE — ED Provider Notes (Signed)
[facility]    CSN: [id number] Arrival date & time: 08/09/16  1157     History   Chief Complaint Chief Complaint  Patient presents with  . Knee Pain  . Work Related Injury    HPI Sherri Banks is a 46 y.o. female.   Patient is a 46 year old black female who apparently stepped on a floor to defect landing on her left knee at work. They sent her over to the urgent care to be seen and evaluated. Should be noted that she came in by wheelchair complaining of pain in the left knee. The time I saw her she was basically rocking back and forth in the doorway with the back turned. Patient states she's had waiting and feels fine. States that initially when she fell she was quite terrified and he did hurt the knee pain has gotten better since taking the Naprosyn tablet. She does have a history of hypertension and does have some swelling of her legs occasionally. 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 Family history not pertinent to today's visit.   The history is provided by the patient. No language interpreter was used.  Knee Pain  Location:  Knee Knee location:  L knee Pain details:    Quality:  Aching   Radiates to:  Does not radiate   Severity:  Mild   Onset quality:  Sudden   Duration:  3 hours   Progression:  Partially resolved Chronicity:  New Dislocation: no   Foreign body present:  No foreign bodies Tetanus status:  Out of date Prior injury to area:  No Relieved by:  NSAIDs   Past Medical History:  Diagnosis Date  . Hypertension     There are no active problems to display for this patient.   Past Surgical History:  Procedure Laterality Date  . BREAST REDUCTION SURGERY    . CESAREAN SECTION      OB History    No data available       Home Medications    Prior to Admission medications   Medication Sig Start Date End Date Taking? Authorizing Provider  amLODipine  (NORVASC) 5 MG tablet Take 5 mg by mouth daily.   Yes Historical Provider, MD  buprenorphine (SUBUTEX) 2 MG SUBL SL tablet Place under the tongue daily.   Yes Historical Provider, MD  triamterene-hydrochlorothiazide (MAXZIDE-25) 37.5-25 MG tablet Take 1 tablet by mouth daily. 02/21/16  Yes Payton Mccallum, MD  venlafaxine XR (EFFEXOR XR) 150 MG 24 hr capsule Take 2 capsules (300 mg total) by mouth daily with breakfast. 11/18/14 12/02/14  Eustace Moore, MD    Family History History reviewed. No pertinent family history.  Social History Social History  Substance Use Topics  . Smoking status: Never Smoker  . Smokeless tobacco: Never Used  . Alcohol use No     Allergies   Ciprofloxacin and Ibuprofen   Review of Systems Review of Systems  Musculoskeletal: Positive for arthralgias and myalgias.  All other systems reviewed and are negative.    Physical Exam Triage Vital Signs ED Triage Vitals  Enc Vitals Group     BP 08/09/16 1321 (!) 147/95     Pulse Rate 08/09/16 1321 85     Resp 08/09/16 1321 16     Temp 08/09/16 1321 97.5 F (36.4 C)     Temp Source 08/09/16 1321 Oral     SpO2 08/09/16 1321 99 %  Weight 08/09/16 1323 210 lb (95.3 kg)     Height 08/09/16 1323  (1.651 m)     Head Circumference --      Peak Flow --      Pain Score 08/09/16 1323 5     Pain Loc --      Pain Edu? --      Excl. in GC? --    No data found.   Updated Vital Signs BP (!) 147/95 (BP Location: Left Arm)   Pulse 85   Temp 97.5 F (36.4 C) (Oral)   Resp 16   Ht  (1.651 m)   Wt 210 lb (95.3 kg)   SpO2 99%   BMI 34.95 kg/m   Visual Acuity Right Eye Distance:   Left Eye Distance:   Bilateral Distance:    Right Eye Near:   Left Eye Near:    Bilateral Near:     Physical Exam  Constitutional: She appears well-developed and well-nourished.  HENT:  Head: Normocephalic and atraumatic.  Right Ear: External ear normal.  Eyes: Pupils are equal, round, and reactive to light.    Pulmonary/Chest: Effort normal.  Musculoskeletal: Normal range of motion. She exhibits edema and tenderness. She exhibits no deformity.       Left knee: She exhibits swelling. She exhibits normal range of motion, no effusion, no deformity, no laceration, no erythema and normal alignment. Tenderness found.  Patient has some tenderness over the medial inferior aspect of the left knee good range of motion and good stability of the joint and no signs of ligamentous(laxation.  Skin: Skin is warm. No erythema.  Psychiatric: She has a normal mood and affect.  Vitals reviewed.    UC Treatments / Results  Labs (all labs ordered are listed, but only abnormal results are displayed) Labs Reviewed - No data to display  EKG  EKG Interpretation None       Radiology No results found.  Procedures Procedures (including critical care time)  Medications Ordered in UC Medications - No data to display   Initial Impression / Assessment and Plan / UC Course  I have reviewed the triage vital signs and the nursing notes.  Pertinent labs & imaging results that were available during my care of the patient were reviewed by me and considered in my medical decision making (see chart for details).     At this time I recommended x-ray of the left knee just to make sure nothing was fractured or broken she declined. She's been on Naprosyn she states one tablet a day she can take 1 over-the-counter Naprosyn up to 3 times a day since his only the 250. Offered her Mobic is in the stomach and she'll have to take 1 tablet day she declined. Also will restrict to 2 hours standing at work she was returned. During. Because she is concerned for 3 referral to [facility] will not be done . Follow-up if needed.  Final Clinical Impressions(s) / UC Diagnoses   Final diagnoses:  Acute pain of left knee  Contusion of left knee, initial encounter    New Prescriptions New Prescriptions   No medications on file    Note: This dictation was prepared with Dragon dictation along with smaller phrase technology. Any transcriptional errors that result from this process are unintentional.   Hassan Rowan, MD 08/09/16 1450

## 2016-08-20 DIAGNOSIS — S8000XA Contusion of unspecified knee, initial encounter: Secondary | ICD-10-CM | POA: Insufficient documentation

## 2016-08-20 HISTORY — DX: Contusion of unspecified knee, initial encounter: S80.00XA

## 2016-11-20 ENCOUNTER — Emergency Department: Payer: 59

## 2016-11-20 ENCOUNTER — Encounter: Payer: Self-pay | Admitting: Emergency Medicine

## 2016-11-20 ENCOUNTER — Emergency Department
Admission: EM | Admit: 2016-11-20 | Discharge: 2016-11-20 | Discharge status: Against Medical Advice | Payer: 59 | Attending: Emergency Medicine | Admitting: Emergency Medicine

## 2016-11-20 DIAGNOSIS — R079 Chest pain, unspecified: Secondary | ICD-10-CM | POA: Insufficient documentation

## 2016-11-20 DIAGNOSIS — R0989 Other specified symptoms and signs involving the circulatory and respiratory systems: Secondary | ICD-10-CM

## 2016-11-20 DIAGNOSIS — I160 Hypertensive urgency: Secondary | ICD-10-CM | POA: Diagnosis not present

## 2016-11-20 DIAGNOSIS — J811 Chronic pulmonary edema: Secondary | ICD-10-CM | POA: Diagnosis not present

## 2016-11-20 DIAGNOSIS — Z79899 Other long term (current) drug therapy: Secondary | ICD-10-CM | POA: Diagnosis not present

## 2016-11-20 DIAGNOSIS — R109 Unspecified abdominal pain: Secondary | ICD-10-CM

## 2016-11-20 DIAGNOSIS — I10 Essential (primary) hypertension: Secondary | ICD-10-CM

## 2016-11-20 LAB — HEPATIC FUNCTION PANEL
ALBUMIN: 3.7 g/dL (ref 3.5–5.0)
ALT: 14 U/L (ref 14–54)
AST: 21 U/L (ref 15–41)
Alkaline Phosphatase: 57 U/L (ref 38–126)
Bilirubin, Direct: <0.1 mg/dL — ABNORMAL LOW (ref 0.1–0.5)
Total Bilirubin: 0.3 mg/dL (ref 0.3–1.2)
Total Protein: 7.9 g/dL (ref 6.5–8.1)

## 2016-11-20 LAB — CBC
HEMATOCRIT: 37.5 % (ref 35.0–47.0)
Hemoglobin: 12.3 g/dL (ref 12.0–16.0)
MCH: 29.3 pg (ref 26.0–34.0)
MCHC: 32.8 g/dL (ref 32.0–36.0)
MCV: 89.3 fL (ref 80.0–100.0)
Platelets: 272 10*3/uL (ref 150–440)
RBC: 4.2 MIL/uL (ref 3.80–5.20)
RDW: 13.7 % (ref 11.5–14.5)
WBC: 6.9 10*3/uL (ref 3.6–11.0)

## 2016-11-20 LAB — BASIC METABOLIC PANEL
Anion gap: 6 (ref 5–15)
BUN: 12 mg/dL (ref 6–20)
CALCIUM: 8.9 mg/dL (ref 8.9–10.3)
CO2: 28 mmol/L (ref 22–32)
Chloride: 105 mmol/L (ref 101–111)
Creatinine, Ser: 0.82 mg/dL (ref 0.44–1.00)
GFR calc Af Amer: >60 mL/min (ref >60)
GFR calc non Af Amer: >60 mL/min (ref >60)
GLUCOSE: 117 mg/dL — AB (ref 65–99)
POTASSIUM: 3.6 mmol/L (ref 3.5–5.1)
SODIUM: 139 mmol/L (ref 135–145)

## 2016-11-20 LAB — LIPASE, BLOOD: LIPASE: 19 U/L (ref 11–51)

## 2016-11-20 LAB — BRAIN NATRIURETIC PEPTIDE: B NATRIURETIC PEPTIDE 5: 20 pg/mL (ref 0.0–100.0)

## 2016-11-20 LAB — TROPONIN I

## 2016-11-20 LAB — FIBRIN DERIVATIVES D-DIMER (ARMC ONLY): FIBRIN DERIVATIVES D-DIMER (ARMC): 503 — AB (ref 0.00–499.00)

## 2016-11-20 MED ORDER — NITROGLYCERIN 0.4 MG SL SUBL
0.4000 mg | SUBLINGUAL_TABLET | SUBLINGUAL | Status: DC | PRN
  Administered 2016-11-20 (×2): 0.4 mg via SUBLINGUAL
  Filled 2016-11-20: qty 1

## 2016-11-20 MED ORDER — GI COCKTAIL ~~LOC~~
30.0000 mL | Freq: Once | ORAL | Status: AC
  Administered 2016-11-20: 30 mL via ORAL
  Filled 2016-11-20: qty 30

## 2016-11-20 MED ORDER — FUROSEMIDE 20 MG PO TABS: 20.0000 mg | ORAL_TABLET | Freq: Two times a day (BID) | ORAL | 0 refills | Status: AC

## 2016-11-20 MED ORDER — CARVEDILOL 6.25 MG PO TABS: 6.2500 mg | ORAL_TABLET | Freq: Two times a day (BID) | ORAL | 0 refills | Status: AC

## 2016-11-20 MED ORDER — IOPAMIDOL (ISOVUE-370) INJECTION 76%
75.0000 mL | Freq: Once | INTRAVENOUS | Status: AC | PRN
  Administered 2016-11-20: 75 mL via INTRAVENOUS

## 2016-11-20 MED ORDER — MORPHINE SULFATE (PF) 4 MG/ML IV SOLN: 4.0000 mg | Freq: Once | INTRAVENOUS | Status: DC

## 2016-11-20 MED ORDER — LABETALOL HCL 5 MG/ML IV SOLN
10.0000 mg | Freq: Once | INTRAVENOUS | Status: AC
  Administered 2016-11-20: 10 mg via INTRAVENOUS
  Filled 2016-11-20: qty 4

## 2016-11-20 MED ORDER — FUROSEMIDE 10 MG/ML IJ SOLN
20.0000 mg | Freq: Once | INTRAMUSCULAR | Status: AC
  Administered 2016-11-20: 20 mg via INTRAVENOUS
  Filled 2016-11-20: qty 4

## 2016-11-20 NOTE — ED Notes (Signed)
Pt is in pain, GafferHunter RN notified.

## 2016-11-20 NOTE — ED Provider Notes (Signed)
Vitals:   11/20/16 0740 11/20/16 0745  BP: (!) 157/89 (!) 142/103  Pulse: 91   Resp: 11 (!) 22  Temp:      Dg Chest 2 View  Result Date: 11/20/2016 CLINICAL DATA:  Centralized chest pain beginning yesterday night. History of hypertension. EXAM: CHEST  2 VIEW COMPARISON:  Chest radiograph Oct 01, 2012 FINDINGS: The cardiac silhouette is mildly enlarged. Mediastinal silhouette is nonsuspicious. Pulmonary vascular congestion without pleural effusion or focal consolidation. No pneumothorax. Soft tissue planes and included osseous structures are nonsuspicious. IMPRESSION: Mild cardiomegaly and pulmonary vascular congestion. Electronically Signed   By: Awilda Metro M.D.   On: 11/20/2016 05:44   Ct Angio Chest Pe W/cm &/or Wo Cm  Result Date: 11/20/2016 CLINICAL DATA:  Chest pain and shortness of breath since 10 p.m. last night. EXAM: CT ANGIOGRAPHY CHEST WITH CONTRAST TECHNIQUE: Multidetector CT imaging of the chest was performed using the standard protocol during bolus administration of intravenous contrast. Multiplanar CT image reconstructions and MIPs were obtained to evaluate the vascular anatomy. CONTRAST:  75 cc Isovue 370 COMPARISON:  Chest radiographs obtained earlier today. FINDINGS: Cardiovascular: Satisfactory opacification of the pulmonary arteries to the segmental level. No evidence of pulmonary embolism. Normal heart size. No pericardial effusion. Mediastinum/Nodes: No enlarged mediastinal, hilar, or axillary lymph nodes. Thyroid gland, trachea, and esophagus demonstrate no significant findings. Lungs/Pleura: Mildly prominent interstitial markings with mild bullous changes. No pleural fluid. Upper Abdomen: Probable sludge and noncalcified gallstones in the gallbladder. Suggestion of mild pericholecystic edema. Musculoskeletal: Mild thoracic and lower cervical spine degenerative changes. Review of the MIP images confirms the above findings. IMPRESSION: 1. No pulmonary emboli. 2. Mild  changes of COPD with possible mild superimposed interstitial pulmonary edema. 3. Probable cholelithiasis and sludge in the gallbladder with a suggestion of mild pericholecystic edema. Cholecystic edema can be seen with cholecystitis. If this is a clinical concern, a right upper quadrant abdomen ultrasound would be recommended. Emphysema (ICD10-J43.9). Electronically Signed   By: Beckie Salts M.D.   On: 11/20/2016 08:35   US Abdomen Limited Ruq  Result Date: 11/20/2016 CLINICAL DATA:  Right upper quadrant pain since last night EXAM: ULTRASOUND ABDOMEN LIMITED RIGHT UPPER QUADRANT COMPARISON:  None. FINDINGS: Gallbladder: Multiple gallstones are present. The largest is 5 mm. There is gallbladder sludge. No wall thickening or Murphy's sign. Common bile duct: Diameter: 4 mm. Liver: The liver is diffusely increased in echogenicity without focal mass. Color Doppler imaging demonstrates hepatopetal flow in the portal vein with patency. IMPRESSION: Cholelithiasis. No evidence of acute cholecystitis. No evidence of biliary dilatation. Diffuse hepatic steatosis. Electronically Signed   By: Jolaine Click M.D.   On: 11/20/2016 10:00   ----------------------------------------- 9:21 AM on 11/20/2016 -----------------------------------------  Patient awake and alert. Reports that she needs to go to provide care to her child who is 65 years old. She reports and I strongly suggested that she needs to stay in the hospital in order to get her blood pressure control as she is showing signs of "heart failure" which can be life-threatening if not addressed immediately. She reports that she can't stay, but she is amenable to having ultrasound of her right upper quadrant done and receiving a shot of Lasix here to reevaluate during that time. I will continue to observe her very closely, her blood pressure remains elevated with some improvement, have written for Lasix, and we'll obtain right upper quadrant as on exam she does report  mild discomfort in the right upper quadrant to palpation.   -----------------------------------------  10:15 AM on 11/20/2016 -----------------------------------------  Ultrasound does not demonstrate any obvious evidence of acute cholecystitis. Patient is afebrile, reports ongoing slight discomfort in the mid chest region. She is requesting to be discharged and follow-up as she needs someone to care for her child, and her mother is currently caring for but has to return to [place] today.    Spoke with Dr. Herbie BaltimoreHarding who recommended patient be placed on Coreg, Lasix, and he will have his schedule her call to set up an appointment as soon as possible for this coming week.  11/20/2016 at 10:21 AM:  The patient requested to leave.  I considered this to be leaving against medical advice. I personally discussed the following with them:  1)  That they currently had a medical condition of chest pain and I am concerned that they may have heart failure, be at risk of having a heart attack, or death.  2)  My proposed course of evaluation and treatment includes, but is not limited to,  admitting to the hospital to control blood pressure and further evaluate for cause of chest pain.  Benefits of staying include possible diagnosis or excluding of a heart attack or heart failure or an alternative serious condition such as pain from the gallbladder or abdomen, which if identified early would lead to appropriate intervention in a timely manner lessening the burden of disability and death.  3) Risks of leaving before this had been completed include: misdiagnosis, worsening illness leading up to and including prolonged or permanent disability or death.  Specific risks pertinent, but not all inclusive, of their current medical condition include but are not limited to death, heart attack.  I also discussed alternatives including having social work assist in providing childcare.  Despite this they stated they wanted  to leave due to need to care for 44-year-old son and refused further evaluation, treatment, or admission at this time.   They appeared clinically sober, were mentating appropriately, were free from distracting injury, had adequately controlled acute pain, appeared to have intact insight, judgment, and reason, and in my opinion had the capacity to make this decision.  Specifically, they were able to verbally state back in a coherent manner their current medical condition/current diagnosis, the proposed course of evaluation and/or treatment, and the risks, benefits, and alternatives of treatment versus leaving against medical advice.   They understand that they may return to seek medical attention here at ANY time they want.  I strongly advised them to return to the Emergency Department immediately if they experience any new or worsening symptoms that concern them, or simply if they reconsider continued evaluation and/or treatment as previously discussed.  This would be without any repercussions, though they understand they likely will need to wait again in the Emergency Department if other patients are in front of them, rather than being brought straight back.  They understood this is another advantage of staying, but still insisted upon leaving.  I recommended they follow-up with Dr. Herbie BaltimoreHarding and cardiology at the earliest available opportunity/appointment for further evaluation and treatment.   The patient was discharged against medical advice.  They did accept written discharge instructions.    Return precautions and treatment recommendations and follow-up discussed with the patient who is agreeable with the plan.      Sharyn CreamerQuale, Mark, MD 11/20/16 1022

## 2016-11-20 NOTE — ED Notes (Signed)
Report received from Noel RN 

## 2016-11-20 NOTE — ED Provider Notes (Signed)
[facility] Emergency Department Provider Note  ____________________________________________   First MD Initiated Contact with Patient 11/20/16 [phone]     (approximate)  I have reviewed the triage vital signs and the nursing notes.   HISTORY  Chief Complaint Chest Pain    HPI Sherri Banks is a 46 y.o. female who presents by EMS for evaluation of about 8 hours of gradually worsening chest pain and pressure.  She reports that it started in the center of her chest with no radiation at about 10 PM.  At first she thought it was secondary to acid reflux even though it feels like more of a heavy, pressure-like sensation.  Nothing made it better or worse including Zantac that she usually takes for acid reflux.  The pain has gradually gotten worse throughout the night and is currently severe.  She feels now like it radiates into the back and she feels short of breath along with some nausea and lightheadedness.  She is in mild distress upon arrival and does appear uncomfortable.  She denies vomiting, recent fever/chills, abdominal pain.  She has no history of blood clots in her legs or lungs, has no active latency, has not been on any long trips recently, and has not been having any leg pain.  She does have some swelling in both of her legs but she says this is because she has not been taking her HCTZ recently which she takes as a diuretic.  She has no specific cardiac history and no history of CHF or ACS.  She is a nonsmoker and has no known family history of ACS.  She does not have diabetes nor hypercholesterolemia.   Past Medical History:  Diagnosis Date  . Hypertension     There are no active problems to display for this patient.   Past Surgical History:  Procedure Laterality Date  . BREAST REDUCTION SURGERY    . CESAREAN SECTION      Prior to Admission medications   Medication Sig Start Date End Date Taking? Authorizing Provider  amLODipine (NORVASC) 5 MG  tablet Take 5 mg by mouth daily.    [provider]  buprenorphine (SUBUTEX) 2 MG SUBL SL tablet Place under the tongue daily.    [provider]  triamterene-hydrochlorothiazide (MAXZIDE-25) 37.5-25 MG tablet Take 1 tablet by mouth daily. 02/21/16   Payton Mccallumonty, [place], MD  venlafaxine XR (EFFEXOR XR) 150 MG 24 hr capsule Take 2 capsules (300 mg total) by mouth daily with breakfast. 11/18/14 12/02/14  Eustace MooreMurray, Laura W, MD    Allergies Ciprofloxacin and Ibuprofen  History reviewed. No pertinent family history.  Social History Social History  Substance Use Topics  . Smoking status: Never Smoker  . Smokeless tobacco: Never Used  . Alcohol use No    Review of Systems Constitutional: No fever/chills Eyes: No visual changes. ENT: No sore throat. Cardiovascular: +chest pain. Respiratory: +shortness of breath. Gastrointestinal: No abdominal pain.  Nausea, no vomiting.  No diarrhea.  No constipation. Genitourinary: Negative for dysuria. Musculoskeletal: Negative for neck pain.  Negative for back pain.  +peripheral edema Integumentary: Negative for rash. Neurological: Negative for headaches, focal weakness or numbness.  +Lightheaded   ____________________________________________   PHYSICAL EXAM:  VITAL SIGNS: ED Triage Vitals  Enc Vitals Group     BP 11/20/16 0523 (!) 182/103     Pulse Rate 11/20/16 0523 100     Resp 11/20/16 0523 16     Temp 11/20/16 0523 98.1 F (36.7 C)  Temp Source 11/20/16 0523 Oral     SpO2 11/20/16 0523 100 %     Weight 11/20/16 0524 95.3 kg (210 lb)     Height 11/20/16 0524 1.676 m (5\' 6" )     Head Circumference --      Peak Flow --      Pain Score 11/20/16 0522 10     Pain Loc --      Pain Edu? --      Excl. in GC? --     Constitutional: Alert and oriented. Appears uncomfortable and in mild distress Eyes: Conjunctivae are normal.  Head: Atraumatic. Nose: No congestion/rhinnorhea. Mouth/Throat: Mucous membranes are  moist. Neck: No stridor.  No meningeal signs.   Cardiovascular: Normal rate, regular rhythm. Good peripheral circulation. Grossly normal heart sounds.  Reproducible chest wall tenderness Respiratory: Increased respiratory effort.  No retractions. Lungs CTAB. Gastrointestinal: Obese. Soft and nontender. No distention.  Musculoskeletal: No lower extremity tenderness nor edema. No gross deformities of extremities. Neurologic:  Normal speech and language. No gross focal neurologic deficits are appreciated.  Skin:  Skin is warm, dry and intact. No rash noted. Psychiatric: Mood and affect are anxious. Speech and behavior are normal.  ____________________________________________   LABS (all labs ordered are listed, but only abnormal results are displayed)  Labs Reviewed  BASIC METABOLIC PANEL - Abnormal; Notable for the following:       Result Value   Glucose, Bld 117 (*)    All other components within normal limits  HEPATIC FUNCTION PANEL - Abnormal; Notable for the following:    Bilirubin, Direct <0.1 (*)    All other components within normal limits  FIBRIN DERIVATIVES D-DIMER ([facility] ONLY) - Abnormal; Notable for the following:    Fibrin derivatives D-dimer ([facility]) 503.00 (*)    All other components within normal limits  CBC  TROPONIN I  LIPASE, BLOOD  BRAIN NATRIURETIC PEPTIDE  POC URINE PREG, ED   ____________________________________________  EKG  ED ECG REPORT I, Prentiss Hammett, the attending physician, personally viewed and interpreted this ECG.  Date: 11/20/2016 EKG Time: 5:24 AM Rate: 98 Rhythm: normal sinus rhythm QRS Axis: normal Intervals: slightly increased PR interval at 198 ms ST/T Wave abnormalities: Non-specific ST segment / T-wave changes, but no evidence of acute ischemia. Narrative Interpretation: unremarkable  ____________________________________________  RADIOLOGY   Dg Chest 2 View  Result Date: 11/20/2016 CLINICAL DATA:  Centralized chest pain  beginning yesterday night. History of hypertension. EXAM: CHEST  2 VIEW COMPARISON:  Chest radiograph Oct 01, 2012 FINDINGS: The cardiac silhouette is mildly enlarged. Mediastinal silhouette is nonsuspicious. Pulmonary vascular congestion without pleural effusion or focal consolidation. No pneumothorax. Soft tissue planes and included osseous structures are nonsuspicious. IMPRESSION: Mild cardiomegaly and pulmonary vascular congestion. Electronically Signed   By: Awilda Metro M.D.   On: 11/20/2016 05:44    ____________________________________________   PROCEDURES  Critical Care performed: No   Procedure(s) performed:   Procedures   ____________________________________________   INITIAL IMPRESSION / ASSESSMENT AND PLAN / ED COURSE  Pertinent labs & imaging results that were available during my care of the patient were reviewed by me and considered in my medical decision making (see chart for details).   Clinical Course as of Nov 21 719  Sat Nov 20, 2016  4098 The patient has mild tachycardia with a rate of about 105 with chest discomfort or shortness of breath.  She has no risk factors for PE/DVT that she also has only one risk factor for ACS.  She has not been taking her HCTZ which she takes as a diuretic and she has some peripheral edema and pulmonary vascular congestion on chest x-ray.  I have added on a BNP and also a d-dimer because although she is low risk, pulmonary embolism could explain her symptoms and it would be good to be able to further risk stratify her.  I understand that I will need to proceed with a CTA if her d-dimer is elevated.  Her troponin is negative and her EKG does not demonstrate any evidence of acute ischemia but she is in a significant amount of distress at this time.  Given that she has had similar symptoms in the past secondary to acid reflux we will try a GI cocktail but I suspect it is unlikely to improve her symptoms and she will require a dose of  morphine for pain control.  She is comfortable with the current plan.  [CF]  0608 Low risk by HEART score (1-2).  [CF]  1610 Normal, non-elevated BNP.  Will try NTG sublingual for on-going chest pain in the setting of significant hypertension and pulmonary vascular congestion.  GI cocktail improved pain from a 10 down to a 7. B Natriuretic Peptide: 20.0 [CF]  0717 The patient reports that her pain is down from a 10 to about a 5 after trying to sublingual nitroglycerin.  Her blood pressure remains elevated with a stellate greater than 100.  The medications seemed to help for the most, however, was the GI cocktail.  I will try a dose of labetalol 10 mg IV to see if her chest pain improves with improvement of her blood pressure; it was refractory to nitroglycerin.  Her d-dimer is slightly elevated but it is elevated above the upper limit of normal.  I discussed this with her and she agrees to my plan for a CTA chest to rule out pulmonary embolism.  Transferring ED care to Dr. Fanny Bien to follow up CTA and reassess.  If chest pain continues unabated in the setting of significant hypertension, she may require admission, but if her symptoms improve, close outpatient follow up may be appropriate.  [CF]    Clinical Course User Index [CF] Loleta Rose, MD    ____________________________________________  FINAL CLINICAL IMPRESSION(S) / ED DIAGNOSES  Final diagnoses:  Chest pain, unspecified type  Pulmonary vascular congestion  Uncontrolled hypertension     MEDICATIONS GIVEN DURING THIS VISIT:  Medications  nitroGLYCERIN (NITROSTAT) SL tablet 0.4 mg (0.4 mg Sublingual Given 11/20/16 0639)  labetalol (NORMODYNE,TRANDATE) injection 10 mg (not administered)  gi cocktail (Maalox,Lidocaine,Donnatal) (30 mLs Oral Given 11/20/16 0611)     NEW OUTPATIENT MEDICATIONS STARTED DURING THIS VISIT:  New Prescriptions   No medications on file    Modified Medications   No medications on file     Discontinued Medications   No medications on file     Note:  This document was prepared using Dragon voice recognition software and may include unintentional dictation errors.    Loleta Rose, MD 11/20/16 [phone]

## 2016-11-20 NOTE — ED Notes (Signed)
Patient transported to [facility] 

## 2016-11-20 NOTE — ED Triage Notes (Signed)
Patient presents to Emergency Department via [facility] with complaints of chest pain (10/10) in central chest without radiation since 10pm last night.  Reports CP feels like pressure started in back and accompanied by lightheadedness, nausea, SOB.  Pt reports hx of HTN but no cardiac hx.  Pt reports cold liek s/sx that started last week with cough, congestion and sore throat.

## 2016-11-20 NOTE — ED Notes (Signed)
Pt denies intercourse in over a year per pt report. Denies taking urine pregnancy.

## 2016-11-20 NOTE — ED Notes (Signed)
Patient transported to X-ray 

## 2016-11-20 NOTE — Discharge Instructions (Signed)
You have been seen in the Emergency Department (ED) today for chest pain.  As we have discussed you need close follow-up. I recommended you stay in the hospital, but due to childcare constraints you are unable to stay. Please call our cardiologist Monday to set up a follow-up appointment early this week.  Please follow up with the recommended doctor as instructed above in these documents regarding today?s emergent visit and your recent symptoms to discuss further management.  Continue to take your regular medications. If you are not doing so already, please also take a daily baby aspirin (81 mg), at least until you follow up with your doctor.  Return to the Emergency Department (ED) if you experience any further chest pain/pressure/tightness, difficulty breathing, or sudden sweating, or other symptoms that concern you.

## 2016-11-21 ENCOUNTER — Encounter: Payer: Self-pay | Admitting: Emergency Medicine

## 2016-11-21 ENCOUNTER — Encounter
Admission: EM | Disposition: A | Discharge status: Home / Self Care | Payer: Self-pay | Source: Home / Self Care | Attending: Emergency Medicine

## 2016-11-21 ENCOUNTER — Emergency Department: Payer: 59 | Admitting: Certified Registered Nurse Anesthetist

## 2016-11-21 ENCOUNTER — Ambulatory Visit (INDEPENDENT_AMBULATORY_CARE_PROVIDER_SITE_OTHER)
Admission: EM | Admit: 2016-11-21 | Discharge: 2016-11-21 | Discharge status: Against Medical Advice | Payer: 59 | Source: Home / Self Care | Attending: Family Medicine | Admitting: Family Medicine

## 2016-11-21 ENCOUNTER — Emergency Department: Payer: 59

## 2016-11-21 ENCOUNTER — Observation Stay
Admission: EM | Admit: 2016-11-21 | Discharge: 2016-11-24 | Disposition: A | Discharge status: Home / Self Care | Payer: 59 | Attending: Surgery | Admitting: Surgery

## 2016-11-21 DIAGNOSIS — R1011 Right upper quadrant pain: Secondary | ICD-10-CM | POA: Diagnosis not present

## 2016-11-21 DIAGNOSIS — F1111 Opioid abuse, in remission: Secondary | ICD-10-CM | POA: Diagnosis not present

## 2016-11-21 DIAGNOSIS — I10 Essential (primary) hypertension: Secondary | ICD-10-CM | POA: Insufficient documentation

## 2016-11-21 DIAGNOSIS — K8046 Calculus of bile duct with acute and chronic cholecystitis without obstruction: Principal | ICD-10-CM | POA: Insufficient documentation

## 2016-11-21 DIAGNOSIS — Z8711 Personal history of peptic ulcer disease: Secondary | ICD-10-CM | POA: Diagnosis not present

## 2016-11-21 DIAGNOSIS — Z79899 Other long term (current) drug therapy: Secondary | ICD-10-CM | POA: Insufficient documentation

## 2016-11-21 DIAGNOSIS — E669 Obesity, unspecified: Secondary | ICD-10-CM | POA: Diagnosis not present

## 2016-11-21 DIAGNOSIS — Z6834 Body mass index (BMI) 34.0-34.9, adult: Secondary | ICD-10-CM | POA: Insufficient documentation

## 2016-11-21 DIAGNOSIS — K810 Acute cholecystitis: Secondary | ICD-10-CM

## 2016-11-21 DIAGNOSIS — K8000 Calculus of gallbladder with acute cholecystitis without obstruction: Secondary | ICD-10-CM

## 2016-11-21 DIAGNOSIS — K819 Cholecystitis, unspecified: Secondary | ICD-10-CM

## 2016-11-21 HISTORY — DX: Adverse effect of unspecified drugs, medicaments and biological substances, initial encounter: T50.905A

## 2016-11-21 HISTORY — DX: Gastric ulcer, unspecified as acute or chronic, without hemorrhage or perforation: K25.9

## 2016-11-21 HISTORY — DX: Gastro-esophageal reflux disease without esophagitis: K21.9

## 2016-11-21 HISTORY — DX: Opioid dependence, uncomplicated: F11.20

## 2016-11-21 HISTORY — DX: Calculus of gallbladder with acute cholecystitis without obstruction: K80.00

## 2016-11-21 LAB — CBC WITH DIFFERENTIAL/PLATELET
BASOS ABS: 0.1 10*3/uL (ref 0–0.1)
BASOS PCT: 1 %
EOS ABS: 0.1 10*3/uL (ref 0–0.7)
Eosinophils Relative: 1 %
HEMATOCRIT: 40.6 % (ref 35.0–47.0)
HEMOGLOBIN: 14 g/dL (ref 12.0–16.0)
LYMPHS ABS: 1.1 10*3/uL (ref 1.0–3.6)
LYMPHS PCT: 9 %
MCH: 30.1 pg (ref 26.0–34.0)
MCHC: 34.5 g/dL (ref 32.0–36.0)
MCV: 87.4 fL (ref 80.0–100.0)
Monocytes Absolute: 0.8 10*3/uL (ref 0.2–0.9)
Monocytes Relative: 6 %
Neutro Abs: 10.8 10*3/uL — ABNORMAL HIGH (ref 1.4–6.5)
Neutrophils Relative %: 83 %
Platelets: 276 10*3/uL (ref 150–440)
RBC: 4.65 MIL/uL (ref 3.80–5.20)
RDW: 13.5 % (ref 11.5–14.5)
WBC: 12.9 10*3/uL — AB (ref 3.6–11.0)

## 2016-11-21 LAB — COMPREHENSIVE METABOLIC PANEL
ALBUMIN: 4.1 g/dL (ref 3.5–5.0)
ALK PHOS: 66 U/L (ref 38–126)
ALT: 14 U/L (ref 14–54)
AST: 23 U/L (ref 15–41)
Anion gap: 8 (ref 5–15)
BILIRUBIN TOTAL: 0.6 mg/dL (ref 0.3–1.2)
BUN: 11 mg/dL (ref 6–20)
CO2: 31 mmol/L (ref 22–32)
Calcium: 9.4 mg/dL (ref 8.9–10.3)
Chloride: 99 mmol/L — ABNORMAL LOW (ref 101–111)
Creatinine, Ser: 0.89 mg/dL (ref 0.44–1.00)
GFR calc Af Amer: >60 mL/min (ref >60)
GFR calc non Af Amer: >60 mL/min (ref >60)
GLUCOSE: 119 mg/dL — AB (ref 65–99)
POTASSIUM: 3.8 mmol/L (ref 3.5–5.1)
SODIUM: 138 mmol/L (ref 135–145)
TOTAL PROTEIN: 8.6 g/dL — AB (ref 6.5–8.1)

## 2016-11-21 LAB — URINALYSIS, COMPLETE (UACMP) WITH MICROSCOPIC
BILIRUBIN URINE: NEGATIVE
GLUCOSE, UA: NEGATIVE mg/dL
HGB URINE DIPSTICK: NEGATIVE
Ketones, ur: NEGATIVE mg/dL
Leukocytes, UA: NEGATIVE
NITRITE: NEGATIVE
PH: 8 (ref 5.0–8.0)
Protein, ur: 100 mg/dL — AB
SPECIFIC GRAVITY, URINE: 1.024 (ref 1.005–1.030)

## 2016-11-21 LAB — LIPASE, BLOOD: Lipase: 21 U/L (ref 11–51)

## 2016-11-21 LAB — POCT PREGNANCY, URINE: Preg Test, Ur: NEGATIVE

## 2016-11-21 SURGERY — LAPAROSCOPIC CHOLECYSTECTOMY
Anesthesia: General

## 2016-11-21 MED ORDER — SUCCINYLCHOLINE CHLORIDE 20 MG/ML IJ SOLN
INTRAMUSCULAR | Status: AC
  Filled 2016-11-21: qty 1

## 2016-11-21 MED ORDER — DEXTROSE 5 % IV SOLN
2.0000 g | INTRAVENOUS | Status: DC
  Administered 2016-11-21 – 2016-11-22 (×2): 2 g via INTRAVENOUS
  Filled 2016-11-21 (×2): qty 2

## 2016-11-21 MED ORDER — ACETAMINOPHEN 650 MG RE SUPP: 650.0000 mg | Freq: Four times a day (QID) | RECTAL | Status: DC | PRN

## 2016-11-21 MED ORDER — METRONIDAZOLE IN NACL 5-0.79 MG/ML-% IV SOLN
500.0000 mg | Freq: Three times a day (TID) | INTRAVENOUS | Status: DC
  Administered 2016-11-21: 500 mg via INTRAVENOUS
  Filled 2016-11-21: qty 100

## 2016-11-21 MED ORDER — ACETAMINOPHEN 325 MG PO TABS: 650.0000 mg | ORAL_TABLET | Freq: Four times a day (QID) | ORAL | Status: DC | PRN

## 2016-11-21 MED ORDER — BUPIVACAINE HCL (PF) 0.5 % IJ SOLN
INTRAMUSCULAR | Status: AC
  Filled 2016-11-21: qty 30

## 2016-11-21 MED ORDER — ONDANSETRON HCL 4 MG/2ML IJ SOLN
4.0000 mg | Freq: Once | INTRAMUSCULAR | Status: AC
  Administered 2016-11-21: 4 mg via INTRAVENOUS
  Filled 2016-11-21: qty 2

## 2016-11-21 MED ORDER — FENTANYL CITRATE (PF) 100 MCG/2ML IJ SOLN
INTRAMUSCULAR | Status: AC
  Filled 2016-11-21: qty 2

## 2016-11-21 MED ORDER — ONDANSETRON 4 MG PO TBDP: 4.0000 mg | ORAL_TABLET | Freq: Four times a day (QID) | ORAL | Status: DC | PRN

## 2016-11-21 MED ORDER — FENTANYL CITRATE (PF) 100 MCG/2ML IJ SOLN
100.0000 ug | Freq: Once | INTRAMUSCULAR | Status: AC
  Administered 2016-11-22 (×2): 50 ug via INTRAVENOUS

## 2016-11-21 MED ORDER — GUAIFENESIN-DM 100-10 MG/5ML PO SYRP
5.0000 mL | ORAL_SOLUTION | ORAL | Status: DC | PRN
  Administered 2016-11-21: 5 mL via ORAL
  Filled 2016-11-21: qty 5

## 2016-11-21 MED ORDER — OXYCODONE-ACETAMINOPHEN 5-325 MG PO TABS
1.0000 | ORAL_TABLET | ORAL | Status: DC | PRN
  Administered 2016-11-21 – 2016-11-22 (×3): 1 via ORAL
  Administered 2016-11-22: 2 via ORAL
  Administered 2016-11-22: 1 via ORAL
  Administered 2016-11-23 (×2): 2 via ORAL
  Filled 2016-11-21: qty 1
  Filled 2016-11-21: qty 2
  Filled 2016-11-21 (×2): qty 1
  Filled 2016-11-21 (×2): qty 2
  Filled 2016-11-21: qty 1

## 2016-11-21 MED ORDER — CHLORHEXIDINE GLUCONATE CLOTH 2 % EX PADS: 6.0000 | MEDICATED_PAD | Freq: Once | CUTANEOUS | Status: DC

## 2016-11-21 MED ORDER — MORPHINE SULFATE (PF) 4 MG/ML IV SOLN
4.0000 mg | Freq: Once | INTRAVENOUS | Status: DC
  Filled 2016-11-21: qty 1

## 2016-11-21 MED ORDER — ENOXAPARIN SODIUM 40 MG/0.4ML ~~LOC~~ SOLN
40.0000 mg | SUBCUTANEOUS | Status: DC
  Administered 2016-11-21 – 2016-11-23 (×3): 40 mg via SUBCUTANEOUS
  Filled 2016-11-21 (×3): qty 0.4

## 2016-11-21 MED ORDER — MORPHINE SULFATE (PF) 4 MG/ML IV SOLN
4.0000 mg | Freq: Once | INTRAVENOUS | Status: AC
  Administered 2016-11-21: 4 mg via INTRAVENOUS
  Filled 2016-11-21: qty 1

## 2016-11-21 MED ORDER — SODIUM CHLORIDE 0.9 % IV BOLUS (SEPSIS)
1000.0000 mL | Freq: Once | INTRAVENOUS | Status: AC
  Administered 2016-11-21: 1000 mL via INTRAVENOUS

## 2016-11-21 MED ORDER — ONDANSETRON HCL 4 MG/2ML IJ SOLN
4.0000 mg | Freq: Four times a day (QID) | INTRAMUSCULAR | Status: DC | PRN
Start: 2016-11-21 — End: 2016-11-24
  Administered 2016-11-22: 4 mg via INTRAVENOUS

## 2016-11-21 MED ORDER — PROPOFOL 10 MG/ML IV BOLUS
INTRAVENOUS | Status: AC
  Filled 2016-11-21: qty 20

## 2016-11-21 MED ORDER — LIDOCAINE HCL (PF) 2 % IJ SOLN
INTRAMUSCULAR | Status: AC
  Filled 2016-11-21: qty 2

## 2016-11-21 MED ORDER — MORPHINE SULFATE (PF) 2 MG/ML IV SOLN
2.0000 mg | INTRAVENOUS | Status: DC | PRN
  Administered 2016-11-21 – 2016-11-23 (×8): 2 mg via INTRAVENOUS
  Filled 2016-11-21 (×8): qty 1

## 2016-11-21 MED ORDER — CEFTRIAXONE SODIUM 2 G IJ SOLR
2.0000 g | INTRAMUSCULAR | Status: DC
  Filled 2016-11-21 (×2): qty 2

## 2016-11-21 MED ORDER — ROCURONIUM BROMIDE 50 MG/5ML IV SOLN
INTRAVENOUS | Status: AC
  Filled 2016-11-21: qty 1

## 2016-11-21 MED ORDER — KCL IN DEXTROSE-NACL 20-5-0.45 MEQ/L-%-% IV SOLN
INTRAVENOUS | Status: DC
  Administered 2016-11-21 – 2016-11-23 (×5): via INTRAVENOUS
  Filled 2016-11-21 (×9): qty 1000

## 2016-11-21 MED ORDER — HYDROMORPHONE HCL 1 MG/ML IJ SOLN
1.0000 mg | Freq: Once | INTRAMUSCULAR | Status: AC
  Administered 2016-11-21: 1 mg via INTRAVENOUS
  Filled 2016-11-21: qty 1

## 2016-11-21 MED ORDER — LIDOCAINE HCL (PF) 1 % IJ SOLN
INTRAMUSCULAR | Status: AC
  Filled 2016-11-21: qty 30

## 2016-11-21 SURGICAL SUPPLY — 34 items
APPLIER CLIP ROT 10 11.4 M/L (STAPLE) ×3
CHLORAPREP W/TINT 26ML (MISCELLANEOUS) ×3 IMPLANT
CLIP APPLIE ROT 10 11.4 M/L (STAPLE) ×1 IMPLANT
DECANTER SPIKE VIAL GLASS SM (MISCELLANEOUS) ×6 IMPLANT
DERMABOND ADVANCED (GAUZE/BANDAGES/DRESSINGS) ×2
DERMABOND ADVANCED .7 DNX12 (GAUZE/BANDAGES/DRESSINGS) ×1 IMPLANT
DEVICE PMI PUNCTURE CLOSURE (MISCELLANEOUS) ×3 IMPLANT
DRESSING SURGICEL FIBRLLR 1X2 (HEMOSTASIS) IMPLANT
DRSG SURGICEL FIBRILLAR 1X2 (HEMOSTASIS)
ELECT REM PT RETURN 9FT ADLT (ELECTROSURGICAL) ×3
ELECTRODE REM PT RTRN 9FT ADLT (ELECTROSURGICAL) ×1 IMPLANT
GLOVE BIO SURGEON STRL SZ7 (GLOVE) ×3 IMPLANT
GLOVE BIOGEL PI IND STRL 7.5 (GLOVE) ×1 IMPLANT
GLOVE BIOGEL PI INDICATOR 7.5 (GLOVE) ×2
GOWN STRL REUS W/ TWL LRG LVL3 (GOWN DISPOSABLE) ×3 IMPLANT
GOWN STRL REUS W/TWL LRG LVL3 (GOWN DISPOSABLE) ×6
IRRIGATION STRYKERFLOW (MISCELLANEOUS) IMPLANT
IRRIGATOR STRYKERFLOW (MISCELLANEOUS)
IV NS 1000ML (IV SOLUTION) ×2
IV NS 1000ML BAXH (IV SOLUTION) ×1 IMPLANT
KIT RM TURNOVER STRD PROC AR (KITS) ×3 IMPLANT
NEEDLE HYPO 22GX1.5 SAFETY (NEEDLE) ×3 IMPLANT
NEEDLE INSUFFLATION 14GA 120MM (NEEDLE) ×3 IMPLANT
NS IRRIG 1000ML POUR BTL (IV SOLUTION) ×3 IMPLANT
PACK LAP CHOLECYSTECTOMY (MISCELLANEOUS) ×3 IMPLANT
POUCH SPECIMEN RETRIEVAL 10MM (ENDOMECHANICALS) ×3 IMPLANT
SCISSORS METZENBAUM CVD 33 (INSTRUMENTS) IMPLANT
SLEEVE ENDOPATH XCEL 5M (ENDOMECHANICALS) ×6 IMPLANT
SUT MNCRL AB 4-0 PS2 18 (SUTURE) ×3 IMPLANT
SUT VICRYL 0 UR6 27IN ABS (SUTURE) ×3 IMPLANT
SUT VICRYL AB 3-0 FS1 BRD 27IN (SUTURE) ×3 IMPLANT
TROCAR XCEL NON-BLD 11X100MML (ENDOMECHANICALS) ×3 IMPLANT
TROCAR XCEL NON-BLD 5MMX100MML (ENDOMECHANICALS) ×3 IMPLANT
TUBING INSUFFLATION (TUBING) ×3 IMPLANT

## 2016-11-21 NOTE — ED Notes (Signed)
Pt to ultrasound

## 2016-11-21 NOTE — ED Provider Notes (Signed)
[facility] ____________________________________________  Time seen: Approximately 11:09 AM  I have reviewed the triage vital signs and the nursing notes.   HISTORY  Chief Complaint Abdominal Pain   HPI Sherri Banks is a 46 y.o. female  presenting for evaluation of what she describes as severe right upper quadrant abdominal pain. Patient states this pain started yesterday afternoon and has continued. Patient expressed concern of constipation as she has not had a full bowel movement since Thursday. Patient further states that due to her chronic pain medication use her normal bowel movements every 2-3 days at baseline. Reports she did use milk of magnesia without results yesterday. Continues to pass gas normally. Patient states that normally when she has constipation pain it is in the lower abdomen which is different than current. States some associated nausea, denies vomiting or diarrhea. Patient was seen most recently early yesterday in the emergency room for conference of workup including chest x-ray, laboratory work, CT chest as well as right upper quadrant ultrasound.  Patient states that she was told that she had gallstones but nothing to worry about. Patient states at that time she was not having right lower quadrant abdominal pain. Denies known fevers. No over-the-counter medication taken otherwise. Reports some movements to make pain in abdomen worse. Denies other aggravating or alleviating factors. Patient denies current chest pain or shortness of breath.Denies changes in chronic subutex medication use.  No LMP recorded. Patient is not currently having periods (Reason: IUD).Denies pregnancy. [place], [place] [facility]: PCP    Past Medical History:  Diagnosis Date  . Hypertension     There are no active problems to display for this patient.   Past Surgical History:  Procedure Laterality Date  . BREAST REDUCTION SURGERY    . CESAREAN SECTION       No  current facility-administered medications for this encounter.   Current Outpatient Prescriptions:  .  amLODipine (NORVASC) 5 MG tablet, Take 5 mg by mouth daily., Disp: , Rfl:  .  buprenorphine (SUBUTEX) 2 MG SUBL SL tablet, Place under the tongue daily., Disp: , Rfl:  .  carvedilol (COREG) 6.25 MG tablet, Take 1 tablet (6.25 mg total) by mouth 2 (two) times daily., Disp: 28 tablet, Rfl: 0 .  furosemide (LASIX) 20 MG tablet, Take 1 tablet (20 mg total) by mouth 2 (two) times daily., Disp: 8 tablet, Rfl: 0 .  venlafaxine XR (EFFEXOR XR) 150 MG 24 hr capsule, Take 2 capsules (300 mg total) by mouth daily with breakfast., Disp: 30 capsule, Rfl: 0 .  triamterene-hydrochlorothiazide (MAXZIDE-25) 37.5-25 MG tablet, Take 1 tablet by mouth daily., Disp: 30 tablet, Rfl: 0  Allergies Ciprofloxacin and Ibuprofen  No family history on file.  Social History Social History  Substance Use Topics  . Smoking status: Never Smoker  . Smokeless tobacco: Never Used  . Alcohol use No    Review of Systems Constitutional: No fever/chills Cardiovascular: Denies chest pain. Respiratory: Denies shortness of breath. Gastrointestinal: As above.  Genitourinary: Negative for dysuria. Musculoskeletal: Negative for back pain. Skin: Negative for rash.  ____________________________________________   PHYSICAL EXAM:  VITAL SIGNS: ED Triage Vitals  Enc Vitals Group     BP 11/21/16 1056 (!) 158/102     Pulse Rate 11/21/16 1056 (!) 119     Resp 11/21/16 1056 18     Temp 11/21/16 1056 98.3 F (36.8 C)     Temp Source 11/21/16 1056 Oral     SpO2 11/21/16 1056 98 %  Weight --      Height --      Head Circumference --      Peak Flow --      Pain Score 11/21/16 1100 9     Pain Loc --      Pain Edu? --      Excl. in GC? --     Constitutional: Alert and oriented. Well appearing and in no acute distress. Cardiovascular: tachycardic,regular rhythm. Grossly normal heart sounds.  Good peripheral  circulation. Respiratory: Normal respiratory effort without tachypnea nor retractions. Breath sounds are clear and equal bilaterally. No wheezes, rales, rhonchi. Gastrointestinal: Moderate right upper quadrant abdominal tenderness with guarding, mild epigastric tenderness, abdomen otherwise soft and nontender. Normal Bowel sounds. No CVA tenderness. Musculoskeletal:  No midline cervical, thoracic or lumbar tenderness to palpation.   Neurologic:  Normal speech and language.  Speech is normal. No gait instability.  Skin:  Skin is warm, dry. Psychiatric: Mood and affect are normal. Speech and behavior are normal. Patient exhibits appropriate insight and judgment   ___________________________________________   LABS (all labs ordered are listed, but only abnormal results are displayed)  Labs Reviewed - No data to display   PROCEDURES Procedures   INITIAL IMPRESSION / ASSESSMENT AND PLAN / ED COURSE  Pertinent labs & imaging results that were available during my care of the patient were reviewed by me and considered in my medical decision making (see chart for details).  Patient overall well appearing. Presenting for right upper quadrant abdominal pain. Ultrasound yesterday showing cholelithiasis, no evidence of acute cholecystitis or biliary dilation. However as patient is presenting for what she describes as severe right upper quadrant abdominal pain, atypical from her typical constipation pain, recommended for patient to be seen in further evaluate emergency room at this time. Discussed the patient may likely need repeat ultrasound versus CT of abdomen and further laboratory studies. Patient agreed to this plan. Discussed the patient felt that she was stable to be transported by private vehicle. Patient states she does not have anyone that can transport her. Patient requests for EMS to be called as she was in too much pain. While RN calling for EMS transport, patient states that she now wants to  drive herself. Patient had previously expressed that she was barely able to self to urgent care location due to the pain and discomfort. Discussed patient recommend patient not to drive herself, patient states that she understands but will be driving herself, patient then left AMA. Conservation witnessed by [facility] CMA.   ____________________________________________   FINAL CLINICAL IMPRESSION(S) / ED DIAGNOSES  Final diagnoses:  RUQ abdominal pain     Discharge Medication List as of 11/21/2016 11:28 AM      Note: This dictation was prepared with Dragon dictation along with smaller phrase technology. Any transcriptional errors that result from this process are unintentional.         Renford DillsMiller, Rayana Geurin, NP 11/21/16 1345

## 2016-11-21 NOTE — ED Triage Notes (Signed)
[facility] for evaluation of RUQ abdominal pain.

## 2016-11-21 NOTE — H&P (Addendum)
ADMISSION SURGICAL HISTORY & PHYSICAL  HISTORY OF PRESENT ILLNESS (HPI):  46 y.o. female [facility] for worsening of RUQ > epigastric abdominal pain for which she initially presented [facility] ED yesterday. At the time or her initial presentation yesterday, patient expressed she was unable to differentiate whether she was experiencing abdominal or chest pain, prompting initially a cardiac workup and then workup for PE due to a minimally elevated D-dimer. Both were essentially unremarkable, and subsequent abdominal ultrasound visualized cholelithiasis without sonographic evidence of cholecystitis.   Since returning home, patient states her now clearly RUQ abdominal pain has worsened substantially despite not having eaten anything, and she denies any N/V, diarrhea, fever/chills, CP, or SOB. She does also mention some constipation with +flatus, but no BM x 3 days despite milk of magnesia, but states pain she's previously experienced with constipation has been more in her lower abdomen than her RUQ. Patient also describes she walks up and down a flight of steps to her apartment and walks several blocks without CP, SOB, or needing to stop walking for CP or SOB. 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  Surgery is consulted by ED physician Dr. Pershing ProudSchaevitz in this context for evaluation and management of acute cholecystitis.  Past Medical History:  Diagnosis Date  . Drug-induced gastric ulcer    NSAIDs  . GERD (gastroesophageal reflux disease)   . Hypertension      PAST SURGICAL HISTORY (PSH):  Past Surgical History:  Procedure Laterality Date  . BREAST REDUCTION SURGERY    . CESAREAN SECTION       MEDICATIONS:  Prior to Admission medications   Medication Sig Start Date End Date Taking? Authorizing Provider  buprenorphine (SUBUTEX) 2 MG SUBL SL tablet Place  under the tongue daily.   Yes [provider]  carvedilol (COREG) 6.25 MG tablet Take 1 tablet (6.25 mg total) by mouth 2 (two) times daily. 11/20/16 11/20/17 Yes Sharyn CreamerQuale, Mark, MD  furosemide (LASIX) 20 MG tablet Take 1 tablet (20 mg total) by mouth 2 (two) times daily. 11/20/16 11/20/17 Yes Sharyn CreamerQuale, Mark, MD  venlafaxine XR (EFFEXOR XR) 150 MG 24 hr capsule Take 2 capsules (300 mg total) by mouth daily with breakfast. 11/18/14 11/21/16 Yes Eustace MooreMurray, Laura W, MD  triamterene-hydrochlorothiazide (MAXZIDE-25) 37.5-25 MG tablet Take 1 tablet by mouth daily. Patient not taking: Reported on 11/21/2016 02/21/16   Payton Mccallumonty, Orlando, MD     ALLERGIES:  Allergies  Allergen Reactions  . Ciprofloxacin Diarrhea  . Ibuprofen Other (See Comments)    Causes stomach ulcers.     SOCIAL HISTORY:  Social History   Social History  . Marital status: Married    Spouse name: N/A  . Number of children: N/A  . Years of education: N/A   Occupational History  . Not on file.   Social History Main Topics  . Smoking status: Never Smoker  . Smokeless tobacco: Never Used  . Alcohol use No  . Drug use: No  . Sexual activity: Not on file   Other Topics Concern  . Not on file   Social History Narrative  . No narrative on file    The patient currently resides (home / rehab facility / nursing home): Home  The patient normally is (ambulatory / bedbound): Ambulatory   FAMILY HISTORY:  No family history on file.   REVIEW OF SYSTEMS:  Constitutional: denies  weight loss, fever, chills, or sweats  Eyes: denies any other vision changes, history of eye injury  ENT: denies sore throat, hearing problems  Respiratory: denies shortness of breath, wheezing  Cardiovascular: denies chest pain, palpitations  Gastrointestinal: abdominal pain, N/V, and bowel function as per HPI Genitourinary: denies burning with urination or urinary frequency Musculoskeletal: denies any other joint pains or cramps  Skin: denies any other  rashes or skin discolorations  Neurological: denies any other headache, dizziness, weakness  Psychiatric: denies any other depression, anxiety   All other review of systems were negative   VITAL SIGNS:  Temp:  [98.3 F (36.8 C)-98.9 F (37.2 C)] 98.9 F (37.2 C) (07/15 1149) Pulse Rate:  [111-119] 111 (07/15 1149) Resp:  [18-20] 20 (07/15 1149) BP: (129-158)/(86-102) 129/86 (07/15 1149) SpO2:  [98 %-99 %] 99 % (07/15 1149) Weight:  [210 lb (95.3 kg)] 210 lb (95.3 kg) (07/15 1148)     Height: 5\' 6"  (167.6 cm) Weight: 210 lb (95.3 kg) BMI (Calculated): 34   INTAKE/OUTPUT:  This shift: No intake/output data recorded.  Last 2 shifts: @IOLAST2SHIFTS @   PHYSICAL EXAM:  Constitutional:  -- Obese body habitus  -- Awake, alert, and oriented x3  Eyes:  -- Pupils equally round and reactive to light  -- No scleral icterus  Ear, nose, and throat:  -- No jugular venous distension  Pulmonary:  -- No crackles  -- Equal breath sounds bilaterally -- Breathing non-labored at rest Cardiovascular:  -- S1, S2 present  -- No pericardial rubs Gastrointestinal:  -- Abdomen soft and obese, though not obviously distended with moderately severe RUQ > epigastric tenderness to palpation, no guarding/rebound  -- No abdominal masses or previous upper abdominal incisional scars appreciated, pulsatile or otherwise  Musculoskeletal and Integumentary:  -- Wounds or skin discoloration: None appreciated -- Extremities: B/L UE and LE FROM, hands and feet warm  Neurologic:  -- Motor function: intact and symmetric -- Sensation: intact and symmetric  Labs:  CBC Latest Ref Rng & Units 11/21/2016 11/20/2016 05/29/2013  WBC 3.6 - 11.0 K/uL 12.9(H) 6.9 7.4  Hemoglobin 12.0 - 16.0 g/dL 16.1 09.6 0.4(V)  Hematocrit 35.0 - 47.0 % 40.6 37.5 32.3(L)  Platelets 150 - 440 K/uL 276 272 465(H)   CMP Latest Ref Rng & Units 11/21/2016 11/20/2016 02/21/2016  Glucose 65 - 99 mg/dL 409(W) 119(J) 89  BUN 6 - 20 mg/dL 11 12 15    Creatinine 0.44 - 1.00 mg/dL 4.78 2.95 6.21  Sodium 135 - 145 mmol/L 138 139 137  Potassium 3.5 - 5.1 mmol/L 3.8 3.6 3.9  Chloride 101 - 111 mmol/L 99(L) 105 101  CO2 22 - 32 mmol/L 31 28 30   Calcium 8.9 - 10.3 mg/dL 9.4 8.9 9.4  Total Protein 6.5 - 8.1 g/dL 3.0(Q) 7.9 -  Total Bilirubin 0.3 - 1.2 mg/dL 0.6 0.3 -  Alkaline Phos 38 - 126 U/L 66 57 -  AST 15 - 41 U/L 23 21 -  ALT 14 - 54 U/L 14 14 -   Troponin I <0.03 ng/mL <0.03    B Natriuretic Peptide 0.0 - 100.0 pg/mL 20.0     Imaging studies:  Limited RUQ Abdominal Ultrasound (11/21/2016) - personally reviewed and discussed with patient Gallbladder wall thickening has progressed, now measuring  7 mm. Numerous shadowing gallstones and sludge are present. Pericholecystic fluid is present. A sonographic Murphy's sign is noted by the sonographer. Common bile duct diameter  measures 3.0 mm, within normal limits.  Limited RUQ Abdominal Ultrasound (11/20/2016) -  personally reviewed and discussed with patient Multiple gallstones are present. The largest is 5 mm. There is gallbladder sludge. No wall thickening or Murphy's sign. Common bile duct diameter measures 4 mm  CT Angiogram Chest for PE assessment (11/19/2016) IMPRESSION: 1. No pulmonary emboli. 2. Mild changes of COPD with possible mild superimposed interstitial pulmonary edema. 3. Probable cholelithiasis and sludge in the gallbladder with a suggestion of mild pericholecystic edema. Cholecystic edema can be seen with cholecystitis. If this is a clinical concern, a right upper quadrant abdomen ultrasound would be recommended.  Chest X-ray (11/19/2016) The cardiac silhouette is mildly enlarged. Mediastinal silhouette  is nonsuspicious. Pulmonary vascular congestion without pleural effusion or focal consolidation. No pneumothorax. Soft tissue planes  and included osseous structures are nonsuspicious.  EKG (11/19/2016) Sinus Rhythm with borderline prolonged PR interval, age  indeterminate probable lateral infarct  Assessment/Plan: (ICD-10's: K81.0) 46 y.o. 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   - NPO, IVF  - IV Rocephin, Flagyl   - all risks, benefits, and alternatives to cholecystectomy were discussed with the patient, all of her questions were answered to her expressed satisfaction, patient expresses she wishes to proceed, and informed consent was accordingly obtained  - will plan for laparoscopic cholecystectomy today  - DVT prophylaxis  All of the above findings and recommendations were discussed with the patient, ED physician, and her RN, and all of patient's questions were answered to her expressed satisfaction.  -- Scherrie Gerlach Earlene Plater, MD, RPVI [place]: [facility] General Surgery - Partnering for exceptional care. Office: [phone]

## 2016-11-21 NOTE — Discharge Instructions (Signed)
Go directly to ER  

## 2016-11-21 NOTE — ED Provider Notes (Signed)
Sherri Banks  ____________________________________________   First MD Initiated Contact with Patient 11/21/16 1153     (approximate)  I have reviewed the triage vital signs and the nursing notes.   HISTORY  Chief Complaint Abdominal Pain   HPI Sherri Banks is a 46 y.o. female with a history of hypertension was presenting to the emergency department with worsening right upper quadrant abdominal pain. She was seen in the emergency department yesterday where she had a CT angiography of the chest which did not show pulmonary embolus but did reveal mild pulmonary edema as well as possible cholecystitis. She then went on to have nausea which showed cholelithiasis but without any cholecystitis. She is returning today with 10 out of 10, sharp upper abdominal pain that is nonradiating. She says she vomited once last night. Is not feeling nauseous at all at this time. Was seen in urgent care this morning and was sent to the emergency department for further workup and evaluation.   Past Medical History:  Diagnosis Date  . Hypertension     There are no active problems to display for this patient.   Past Surgical History:  Procedure Laterality Date  . BREAST REDUCTION SURGERY    . CESAREAN SECTION      Prior to Admission medications   Medication Sig Start Date End Date Taking? Authorizing Provider  amLODipine (NORVASC) 5 MG tablet Take 5 mg by mouth daily.    [provider]  buprenorphine (SUBUTEX) 2 MG SUBL SL tablet Place under the tongue daily.    [provider]  carvedilol (COREG) 6.25 MG tablet Take 1 tablet (6.25 mg total) by mouth 2 (two) times daily. 11/20/16 11/20/17  Sherri CreamerQuale, Mark, MD  furosemide (LASIX) 20 MG tablet Take 1 tablet (20 mg total) by mouth 2 (two) times daily. 11/20/16 11/20/17  Sherri CreamerQuale, Mark, MD  triamterene-hydrochlorothiazide (MAXZIDE-25) 37.5-25 MG tablet Take 1 tablet by mouth daily.  02/21/16   Sherri Mccallumonty, Orlando, MD  venlafaxine XR (EFFEXOR XR) 150 MG 24 hr capsule Take 2 capsules (300 mg total) by mouth daily with breakfast. 11/18/14 11/21/16  Sherri MooreMurray, Laura W, MD    Allergies Ciprofloxacin and Ibuprofen  No family history on file.  Social History Social History  Substance Use Topics  . Smoking status: Never Smoker  . Smokeless tobacco: Never Used  . Alcohol use No    Review of Systems  Constitutional: No fever/chills Eyes: No visual changes. ENT: No sore throat. Cardiovascular: Denies chest pain. Respiratory: Denies shortness of breath. Gastrointestinal: No diarrhea.  No constipation. Genitourinary: Negative for dysuria. Musculoskeletal: Negative for back pain. Skin: Negative for rash. Neurological: Negative for headaches, focal weakness or numbness.   ____________________________________________   PHYSICAL EXAM:  VITAL SIGNS: ED Triage Vitals  Enc Vitals Group     BP 11/21/16 1149 129/86     Pulse Rate 11/21/16 1149 (!) 111     Resp 11/21/16 1149 20     Temp 11/21/16 1149 98.9 F (37.2 C)     Temp Source 11/21/16 1149 Oral     SpO2 11/21/16 1149 99 %     Weight 11/21/16 1148 210 lb (95.3 kg)     Height 11/21/16 1148 5\' 6"  (1.676 m)     Head Circumference --      Peak Flow --      Pain Score 11/21/16 1147 10     Pain Loc --      Pain Edu? --  Excl. in GC? --     Constitutional: Alert and oriented.  Patient uncomfortable. Tearful. Eyes: Conjunctivae are normal.  Head: Atraumatic. Nose: No congestion/rhinnorhea. Mouth/Throat: Mucous membranes are moist.  Neck: No stridor.   Cardiovascular: Normal rate, regular rhythm. Grossly normal heart sounds.  Respiratory: Normal respiratory effort.  No retractions. Lungs CTAB. Gastrointestinal: Soft but with moderate to severe right upper quadrant tenderness with a positive Murphy sign. Musculoskeletal: No lower extremity tenderness nor edema.  No joint effusions. Neurologic:  Normal speech and  language. No gross focal neurologic deficits are appreciated. Skin:  Skin is warm, dry and intact. No rash noted. Psychiatric: Mood and affect are normal. Speech and behavior are normal.  ____________________________________________   LABS (all labs ordered are listed, but only abnormal results are displayed)  Labs Reviewed  CBC WITH DIFFERENTIAL/PLATELET - Abnormal; Notable for the following:       Result Value   WBC 12.9 (*)    Neutro Abs 10.8 (*)    All other components within normal limits  URINALYSIS, COMPLETE (UACMP) WITH MICROSCOPIC  COMPREHENSIVE METABOLIC PANEL  LIPASE, BLOOD  POC URINE PREG, ED   ____________________________________________  EKG   ____________________________________________  RADIOLOGY  Acute cholecystitis found on the right upper quadrant ultrasound. ____________________________________________   PROCEDURES  Procedure(s) performed:   Procedures  Critical Care performed:   ____________________________________________   INITIAL IMPRESSION / ASSESSMENT AND PLAN / ED COURSE  Pertinent labs & imaging results that were available during my care of the patient were reviewed by me and considered in my medical decision making (see chart for details).  ----------------------------------------- 2:03 PM on 11/21/2016 -----------------------------------------  Patient's pain is improved with morphine and Dilaudid. Patient aware of diagnosis of acute cholecystitis. I discussed case Dr. Earlene Plater will be admitting the patient to the hospital.      ____________________________________________   FINAL CLINICAL IMPRESSION(S) / ED DIAGNOSES  Final diagnoses:  RUQ pain  Cholecystitis.    NEW MEDICATIONS STARTED DURING THIS VISIT:  New Prescriptions   No medications on file     [place]:  This document was prepared using Dragon voice recognition software and may include unintentional dictation errors.     Sherri Blazer,  MD 11/21/16 [phone]

## 2016-11-21 NOTE — Consult Note (Addendum)
SURGICAL CONSULTATION NOTE (initial) - cpt: 99244  HISTORY OF PRESENT ILLNESS (HPI):  46 y.o. female returned to [facility] ED today for worsening of RUQ > epigastric abdominal pain for which she initially presented to [facility] ED yesterday. At the time or her initial presentation yesterday, patient expressed she was unable to differentiate whether she was experiencing abdominal or chest pain, prompting initially a cardiac workup and then workup for PE due to a minimally elevated D-dimer. Both were essentially unremarkable, and subsequent abdominal ultrasound visualized cholelithiasis without sonographic evidence of cholecystitis.   Since returning home, patient states her now clearly RUQ abdominal pain has worsened substantially despite not having eaten anything, and she denies any N/V, diarrhea, fever/chills, CP, or SOB. She does also mention some constipation with +flatus, but no BM x 3 days despite milk of magnesia, but states pain she's previously experienced with constipation has been more in her lower abdomen than her RUQ. Patient also describes she walks up and down a flight of steps to her apartment and walks several blocks without CP, SOB, or needing to stop walking for CP or SOB. 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  Surgery is consulted by ED physician Dr. Pershing Proud in this context for evaluation and management of acute cholecystitis.  PAST MEDICAL HISTORY (PMH):  Past Medical History:  Diagnosis Date  . Drug-induced gastric ulcer    NSAIDs  . GERD (gastroesophageal reflux disease)   . Hypertension      PAST SURGICAL HISTORY (PSH):  Past Surgical History:  Procedure Laterality Date  . BREAST REDUCTION SURGERY    . CESAREAN SECTION       MEDICATIONS:  Prior to Admission medications   Medication Sig Start Date End Date Taking? Authorizing Provider   buprenorphine (SUBUTEX) 2 MG SUBL SL tablet Place under the tongue daily.   Yes [provider]  carvedilol (COREG) 6.25 MG tablet Take 1 tablet (6.25 mg total) by mouth 2 (two) times daily. 11/20/16 11/20/17 Yes Sharyn Creamer, MD  furosemide (LASIX) 20 MG tablet Take 1 tablet (20 mg total) by mouth 2 (two) times daily. 11/20/16 11/20/17 Yes Sharyn Creamer, MD  venlafaxine XR (EFFEXOR XR) 150 MG 24 hr capsule Take 2 capsules (300 mg total) by mouth daily with breakfast. 11/18/14 11/21/16 Yes Eustace Moore, MD  triamterene-hydrochlorothiazide (MAXZIDE-25) 37.5-25 MG tablet Take 1 tablet by mouth daily. Patient not taking: Reported on 11/21/2016 02/21/16   Payton Mccallum, MD     ALLERGIES:  Allergies  Allergen Reactions  . Ciprofloxacin Diarrhea  . Ibuprofen Other (See Comments)    Causes stomach ulcers.     SOCIAL HISTORY:  Social History   Social History  . Marital status: Married    Spouse name: N/A  . Number of children: N/A  . Years of education: N/A   Occupational History  . Not on file.   Social History Main Topics  . Smoking status: Never Smoker  . Smokeless tobacco: Never Used  . Alcohol use No  . Drug use: No  . Sexual activity: Not on file   Other Topics Concern  . Not on file   Social History Narrative  . No narrative on file    The patient currently resides (home / rehab facility / nursing home): Home  The patient normally is (ambulatory / bedbound): Ambulatory   FAMILY HISTORY:  No family history on file.  REVIEW OF SYSTEMS:  Constitutional: denies weight loss, fever, chills, or sweats  Eyes: denies any other vision changes, history of eye injury  ENT: denies sore throat, hearing problems  Respiratory: denies shortness of breath, wheezing  Cardiovascular: denies chest pain, palpitations  Gastrointestinal: abdominal pain, N/V, and bowel function as per HPI Genitourinary: denies burning with urination or urinary frequency Musculoskeletal: denies any  other joint pains or cramps  Skin: denies any other rashes or skin discolorations  Neurological: denies any other headache, dizziness, weakness  Psychiatric: denies any other depression, anxiety   All other review of systems were negative   VITAL SIGNS:  Temp:  [98.3 F (36.8 C)-98.9 F (37.2 C)] 98.9 F (37.2 C) (07/15 1149) Pulse Rate:  [111-119] 111 (07/15 1149) Resp:  [18-20] 20 (07/15 1149) BP: (129-158)/(86-102) 129/86 (07/15 1149) SpO2:  [98 %-99 %] 99 % (07/15 1149) Weight:  [210 lb (95.3 kg)] 210 lb (95.3 kg) (07/15 1148)     Height: 5\' 6"  (167.6 cm) Weight: 210 lb (95.3 kg) BMI (Calculated): 34   INTAKE/OUTPUT:  This shift: No intake/output data recorded.  Last 2 shifts: @IOLAST2SHIFTS @   PHYSICAL EXAM:  Constitutional:  -- Obese body habitus  -- Awake, alert, and oriented x3  Eyes:  -- Pupils equally round and reactive to light  -- No scleral icterus  Ear, nose, and throat:  -- No jugular venous distension  Pulmonary:  -- No crackles  -- Equal breath sounds bilaterally -- Breathing non-labored at rest Cardiovascular:  -- S1, S2 present  -- No pericardial rubs Gastrointestinal:  -- Abdomen soft and obese, though not obviously distended with moderately severe RUQ > epigastric tenderness to palpation, no guarding/rebound  -- No abdominal masses or previous upper abdominal incisional scars appreciated, pulsatile or otherwise  Musculoskeletal and Integumentary:  -- Wounds or skin discoloration: None appreciated -- Extremities: B/L UE and LE FROM, hands and feet warm  Neurologic:  -- Motor function: intact and symmetric -- Sensation: intact and symmetric  Labs:  CBC Latest Ref Rng & Units 11/21/2016 11/20/2016 05/29/2013  WBC 3.6 - 11.0 K/uL 12.9(H) 6.9 7.4  Hemoglobin 12.0 - 16.0 g/dL 16.1 09.6 0.4(V)  Hematocrit 35.0 - 47.0 % 40.6 37.5 32.3(L)  Platelets 150 - 440 K/uL 276 272 465(H)   CMP Latest Ref Rng & Units 11/21/2016 11/20/2016 02/21/2016  Glucose 65 -  99 mg/dL 409(W) 119(J) 89  BUN 6 - 20 mg/dL 11 12 15   Creatinine 0.44 - 1.00 mg/dL 4.78 2.95 6.21  Sodium 135 - 145 mmol/L 138 139 137  Potassium 3.5 - 5.1 mmol/L 3.8 3.6 3.9  Chloride 101 - 111 mmol/L 99(L) 105 101  CO2 22 - 32 mmol/L 31 28 30   Calcium 8.9 - 10.3 mg/dL 9.4 8.9 9.4  Total Protein 6.5 - 8.1 g/dL 3.0(Q) 7.9 -  Total Bilirubin 0.3 - 1.2 mg/dL 0.6 0.3 -  Alkaline Phos 38 - 126 U/L 66 57 -  AST 15 - 41 U/L 23 21 -  ALT 14 - 54 U/L 14 14 -   Troponin I <0.03 ng/mL <0.03    B Natriuretic Peptide 0.0 - 100.0 pg/mL 20.0     Imaging studies:  Limited RUQ Abdominal Ultrasound (11/21/2016) - personally reviewed and discussed with patient Gallbladder wall thickening has progressed, now measuring  7 mm. Numerous shadowing gallstones and sludge are present. Pericholecystic fluid is present. A sonographic Murphy's sign is noted by the sonographer. Common bile duct diameter  measures 3.0 mm, within normal limits.  Limited RUQ Abdominal Ultrasound (11/20/2016) - personally reviewed and discussed with patient Multiple gallstones are present. The largest is 5 mm. There is gallbladder sludge. No wall thickening or Murphy's sign. Common bile duct diameter measures 4 mm  CT Angiogram Chest for PE assessment (11/19/2016) IMPRESSION: 1. No pulmonary emboli. 2. Mild changes of COPD with possible mild superimposed interstitial pulmonary edema. 3. Probable cholelithiasis and sludge in the gallbladder with a suggestion of mild pericholecystic edema. Cholecystic edema can be seen with cholecystitis. If this is a clinical concern, a right upper quadrant abdomen ultrasound would be recommended.  Chest X-ray (11/19/2016) The cardiac silhouette is mildly enlarged. Mediastinal silhouette  is nonsuspicious. Pulmonary vascular congestion without pleural effusion or focal consolidation. No pneumothorax. Soft tissue planes  and included osseous structures are nonsuspicious.  EKG  (11/19/2016) Sinus Rhythm with borderline prolonged PR interval, age indeterminate probable lateral infarct  Assessment/Plan: (ICD-10's: K81.0) 46 y.o. female with acute cholecystitis, complicated by pertinent comorbidities including obesity, HTN, GERD, PUD, and reportedly remote history of prescription narcotics abuse (>5 years ago per patient's report).   - NPO, IVF  - IV Rocephin, Flagyl   - all risks, benefits, and alternatives to cholecystectomy were discussed with the patient, all of her questions were answered to her expressed satisfaction, patient expresses she wishes to proceed, and informed consent was accordingly obtained  - will plan for laparoscopic cholecystectomy today  - DVT prophylaxis  All of the above findings and recommendations were discussed with the patient, ED physician, and her RN, and all of patient's questions were answered to her expressed satisfaction.  Thank you for the opportunity to participate in this patient's care.   -- Scherrie GerlachJason E. Earlene Plateravis, MD, RPVI : [facility] - Partnering for exceptional care. Office: [phone]

## 2016-11-21 NOTE — ED Triage Notes (Signed)
Pt Having abdominal pain on the RUQ and said she is constipated. Last BM was Thursday afternoon. Said she is on a medication that causes constipation. Was seen in the ED yesterday for chest pain but thinks it was gas related. No nausea, vomiting but is having loss of appetite. Said they did imaging but it was heart related and didn't do xray of her abdomen. Is taking milk of magnesium without relief.

## 2016-11-21 NOTE — Progress Notes (Addendum)
ADDENDUM:  Anesthesiologist requests cardiology evaluation prior to consideration for non-emergent surgery (for a not immediately life-threatening medical problem). Accordingly, patient will be admitted with clear liquids diet, IV antibiotics (Rocephin and Flagyl), cardiology consultation, and pain control prn (though the latter may be limited by patient's narcotics tolerance with chronic buprenorphine and reported allergy to NSAID's with itching, facial swelling, and a reported history of gastric ulcers attributed to NSAID's).  Anticipate cholecystectomy tomorrow, pending cardiology.  -- Scherrie GerlachJason E. Earlene Plateravis, MD, RPVI [place]: [facility] - Partnering for exceptional care. Office: [phone]

## 2016-11-22 ENCOUNTER — Encounter: Payer: Self-pay | Admitting: Nurse Practitioner

## 2016-11-22 ENCOUNTER — Observation Stay: Payer: 59 | Admitting: Anesthesiology

## 2016-11-22 ENCOUNTER — Encounter
Admission: EM | Disposition: A | Discharge status: Home / Self Care | Payer: Self-pay | Source: Home / Self Care | Attending: Emergency Medicine

## 2016-11-22 ENCOUNTER — Encounter: Payer: Self-pay | Admitting: Anesthesiology

## 2016-11-22 DIAGNOSIS — K8000 Calculus of gallbladder with acute cholecystitis without obstruction: Secondary | ICD-10-CM

## 2016-11-22 DIAGNOSIS — Z01810 Encounter for preprocedural cardiovascular examination: Secondary | ICD-10-CM

## 2016-11-22 DIAGNOSIS — I10 Essential (primary) hypertension: Secondary | ICD-10-CM

## 2016-11-22 HISTORY — PX: CHOLECYSTECTOMY: SHX55

## 2016-11-22 LAB — COMPREHENSIVE METABOLIC PANEL
ALBUMIN: 3.2 g/dL — AB (ref 3.5–5.0)
ALT: 18 U/L (ref 14–54)
AST: 26 U/L (ref 15–41)
Alkaline Phosphatase: 57 U/L (ref 38–126)
Anion gap: 7 (ref 5–15)
BUN: 10 mg/dL (ref 6–20)
CHLORIDE: 101 mmol/L (ref 101–111)
CO2: 28 mmol/L (ref 22–32)
CREATININE: 0.82 mg/dL (ref 0.44–1.00)
Calcium: 8.7 mg/dL — ABNORMAL LOW (ref 8.9–10.3)
GFR calc Af Amer: >60 mL/min (ref >60)
GLUCOSE: 140 mg/dL — AB (ref 65–99)
Potassium: 3.5 mmol/L (ref 3.5–5.1)
Sodium: 136 mmol/L (ref 135–145)
Total Bilirubin: 0.5 mg/dL (ref 0.3–1.2)
Total Protein: 7.3 g/dL (ref 6.5–8.1)

## 2016-11-22 LAB — CBC
HCT: 36 % (ref 35.0–47.0)
Hemoglobin: 12 g/dL (ref 12.0–16.0)
MCH: 29.6 pg (ref 26.0–34.0)
MCHC: 33.5 g/dL (ref 32.0–36.0)
MCV: 88.6 fL (ref 80.0–100.0)
PLATELETS: 231 10*3/uL (ref 150–440)
RBC: 4.06 MIL/uL (ref 3.80–5.20)
RDW: 14.1 % (ref 11.5–14.5)
WBC: 12.5 10*3/uL — AB (ref 3.6–11.0)

## 2016-11-22 LAB — SURGICAL PCR SCREEN
MRSA, PCR: NEGATIVE
Staphylococcus aureus: NEGATIVE

## 2016-11-22 SURGERY — LAPAROSCOPIC CHOLECYSTECTOMY
Anesthesia: General

## 2016-11-22 MED ORDER — SUGAMMADEX SODIUM 200 MG/2ML IV SOLN
INTRAVENOUS | Status: DC | PRN
  Administered 2016-11-22: 200 mg via INTRAVENOUS

## 2016-11-22 MED ORDER — ROCURONIUM BROMIDE 100 MG/10ML IV SOLN
INTRAVENOUS | Status: DC | PRN
  Administered 2016-11-22: 10 mg via INTRAVENOUS
  Administered 2016-11-22: 50 mg via INTRAVENOUS
  Administered 2016-11-22: 20 mg via INTRAVENOUS
  Administered 2016-11-22: 10 mg via INTRAVENOUS

## 2016-11-22 MED ORDER — PHENYLEPHRINE HCL 10 MG/ML IJ SOLN
INTRAMUSCULAR | Status: AC
  Filled 2016-11-22: qty 1

## 2016-11-22 MED ORDER — ACETAMINOPHEN 10 MG/ML IV SOLN
INTRAVENOUS | Status: AC
  Filled 2016-11-22: qty 100

## 2016-11-22 MED ORDER — DEXAMETHASONE SODIUM PHOSPHATE 10 MG/ML IJ SOLN
INTRAMUSCULAR | Status: DC | PRN
  Administered 2016-11-22: 10 mg via INTRAVENOUS

## 2016-11-22 MED ORDER — LIDOCAINE HCL (CARDIAC) 20 MG/ML IV SOLN
INTRAVENOUS | Status: DC | PRN
  Administered 2016-11-22: 60 mg via INTRAVENOUS

## 2016-11-22 MED ORDER — BUPRENORPHINE HCL-NALOXONE HCL 2-0.5 MG SL SUBL: 1.0000 | SUBLINGUAL_TABLET | Freq: Every day | SUBLINGUAL | Status: DC

## 2016-11-22 MED ORDER — PHENYLEPHRINE HCL 10 MG/ML IJ SOLN
INTRAMUSCULAR | Status: DC | PRN
  Administered 2016-11-22 (×7): 200 ug via INTRAVENOUS

## 2016-11-22 MED ORDER — PIPERACILLIN-TAZOBACTAM 3.375 G IVPB
3.3750 g | Freq: Three times a day (TID) | INTRAVENOUS | Status: DC
  Administered 2016-11-22 – 2016-11-24 (×5): 3.375 g via INTRAVENOUS
  Filled 2016-11-22 (×5): qty 50

## 2016-11-22 MED ORDER — PROPOFOL 10 MG/ML IV BOLUS
INTRAVENOUS | Status: AC
  Filled 2016-11-22: qty 20

## 2016-11-22 MED ORDER — CARVEDILOL 6.25 MG PO TABS: 6.2500 mg | ORAL_TABLET | Freq: Two times a day (BID) | ORAL | Status: DC

## 2016-11-22 MED ORDER — ONDANSETRON HCL 4 MG/2ML IJ SOLN
INTRAMUSCULAR | Status: AC
  Filled 2016-11-22: qty 2

## 2016-11-22 MED ORDER — PROMETHAZINE HCL 25 MG/ML IJ SOLN: 6.2500 mg | INTRAMUSCULAR | Status: DC | PRN

## 2016-11-22 MED ORDER — LACTATED RINGERS IV SOLN
INTRAVENOUS | Status: DC | PRN
  Administered 2016-11-22 (×2): via INTRAVENOUS

## 2016-11-22 MED ORDER — HYDROMORPHONE HCL 1 MG/ML IJ SOLN
INTRAMUSCULAR | Status: AC
  Filled 2016-11-22: qty 1

## 2016-11-22 MED ORDER — HYDROMORPHONE HCL 1 MG/ML IJ SOLN
INTRAMUSCULAR | Status: DC | PRN
  Administered 2016-11-22: 1 mg via INTRAVENOUS

## 2016-11-22 MED ORDER — LIDOCAINE HCL (PF) 2 % IJ SOLN
INTRAMUSCULAR | Status: AC
  Filled 2016-11-22: qty 2

## 2016-11-22 MED ORDER — CARVEDILOL 6.25 MG PO TABS
6.2500 mg | ORAL_TABLET | Freq: Two times a day (BID) | ORAL | Status: DC
  Administered 2016-11-22 – 2016-11-24 (×4): 6.25 mg via ORAL
  Filled 2016-11-22 (×4): qty 1

## 2016-11-22 MED ORDER — PROPOFOL 10 MG/ML IV BOLUS
INTRAVENOUS | Status: DC | PRN
  Administered 2016-11-22: 150 mg via INTRAVENOUS

## 2016-11-22 MED ORDER — MORPHINE SULFATE (PF) 2 MG/ML IV SOLN
2.0000 mg | Freq: Once | INTRAVENOUS | Status: AC
  Administered 2016-11-22: 2 mg via INTRAVENOUS
  Filled 2016-11-22: qty 1

## 2016-11-22 MED ORDER — FENTANYL CITRATE (PF) 100 MCG/2ML IJ SOLN: 25.0000 ug | INTRAMUSCULAR | Status: DC | PRN

## 2016-11-22 MED ORDER — FENTANYL CITRATE (PF) 100 MCG/2ML IJ SOLN
INTRAMUSCULAR | Status: AC
  Filled 2016-11-22: qty 2

## 2016-11-22 MED ORDER — FENTANYL CITRATE (PF) 100 MCG/2ML IJ SOLN
25.0000 ug | INTRAMUSCULAR | Status: DC | PRN
  Administered 2016-11-22 (×4): 25 ug via INTRAVENOUS

## 2016-11-22 MED ORDER — ONDANSETRON HCL 4 MG/2ML IJ SOLN: 4.0000 mg | Freq: Once | INTRAMUSCULAR | Status: DC | PRN

## 2016-11-22 MED ORDER — OXYCODONE HCL 5 MG PO TABS: 5.0000 mg | ORAL_TABLET | Freq: Once | ORAL | Status: DC | PRN

## 2016-11-22 MED ORDER — KETOROLAC TROMETHAMINE 30 MG/ML IJ SOLN
INTRAMUSCULAR | Status: AC
  Filled 2016-11-22: qty 1

## 2016-11-22 MED ORDER — MIDAZOLAM HCL 2 MG/2ML IJ SOLN
INTRAMUSCULAR | Status: DC | PRN
  Administered 2016-11-22: 2 mg via INTRAVENOUS

## 2016-11-22 MED ORDER — SUGAMMADEX SODIUM 200 MG/2ML IV SOLN
INTRAVENOUS | Status: AC
  Filled 2016-11-22: qty 4

## 2016-11-22 MED ORDER — HYDROMORPHONE HCL 1 MG/ML IJ SOLN: 0.2500 mg | INTRAMUSCULAR | Status: DC | PRN

## 2016-11-22 MED ORDER — MEPERIDINE HCL 50 MG/ML IJ SOLN: 6.2500 mg | INTRAMUSCULAR | Status: DC | PRN

## 2016-11-22 MED ORDER — OXYCODONE HCL 5 MG/5ML PO SOLN: 5.0000 mg | Freq: Once | ORAL | Status: DC | PRN

## 2016-11-22 MED ORDER — MIDAZOLAM HCL 2 MG/2ML IJ SOLN
INTRAMUSCULAR | Status: AC
  Filled 2016-11-22: qty 2

## 2016-11-22 MED ORDER — PIPERACILLIN-TAZOBACTAM 3.375 G IVPB 30 MIN: 3.3750 g | Freq: Three times a day (TID) | INTRAVENOUS | Status: DC

## 2016-11-22 MED ORDER — LIDOCAINE HCL 1 % IJ SOLN
INTRAMUSCULAR | Status: DC | PRN
  Administered 2016-11-22: 10 mL

## 2016-11-22 MED ORDER — ROCURONIUM BROMIDE 50 MG/5ML IV SOLN
INTRAVENOUS | Status: AC
  Filled 2016-11-22: qty 2

## 2016-11-22 MED ORDER — ACETAMINOPHEN 10 MG/ML IV SOLN
INTRAVENOUS | Status: DC | PRN
  Administered 2016-11-22: 1000 mg via INTRAVENOUS

## 2016-11-22 MED ORDER — DEXAMETHASONE SODIUM PHOSPHATE 10 MG/ML IJ SOLN
INTRAMUSCULAR | Status: AC
  Filled 2016-11-22: qty 1

## 2016-11-22 SURGICAL SUPPLY — 48 items
ADHESIVE MASTISOL STRL (MISCELLANEOUS) ×3 IMPLANT
APPLIER CLIP ROT 10 11.4 M/L (STAPLE) ×3
BAG COUNTER SPONGE EZ (MISCELLANEOUS) ×2 IMPLANT
BLADE SURG SZ11 CARB STEEL (BLADE) ×3 IMPLANT
BULB RESERV EVAC DRAIN JP 100C (MISCELLANEOUS) ×3 IMPLANT
CANISTER SUCT 1200ML W/VALVE (MISCELLANEOUS) ×3 IMPLANT
CATH CHOLANG 76X19 KUMAR (CATHETERS) ×3 IMPLANT
CHLORAPREP W/TINT 26ML (MISCELLANEOUS) ×3 IMPLANT
CLIP APPLIE ROT 10 11.4 M/L (STAPLE) ×1 IMPLANT
CLOSURE WOUND 1/2 X4 (GAUZE/BANDAGES/DRESSINGS)
CONRAY 60ML FOR OR (MISCELLANEOUS) IMPLANT
COUNTER SPONGE BAG EZ (MISCELLANEOUS) ×1
DECANTER SPIKE VIAL GLASS SM (MISCELLANEOUS) IMPLANT
DRAIN CHANNEL JP 19F (MISCELLANEOUS) ×3 IMPLANT
DRAPE SHEET LG 3/4 BI-LAMINATE (DRAPES) ×3 IMPLANT
DRSG TEGADERM 2-3/8X2-3/4 SM (GAUZE/BANDAGES/DRESSINGS) ×12 IMPLANT
DRSG TELFA 4X3 1S NADH ST (GAUZE/BANDAGES/DRESSINGS) ×3 IMPLANT
ELECT REM PT RETURN 9FT ADLT (ELECTROSURGICAL) ×3
ELECTRODE REM PT RTRN 9FT ADLT (ELECTROSURGICAL) ×1 IMPLANT
GLOVE BIO SURGEON STRL SZ7.5 (GLOVE) ×3 IMPLANT
GLOVE INDICATOR 8.0 STRL GRN (GLOVE) ×3 IMPLANT
GOWN STRL REUS W/ TWL LRG LVL3 (GOWN DISPOSABLE) ×3 IMPLANT
GOWN STRL REUS W/TWL LRG LVL3 (GOWN DISPOSABLE) ×6
GRASPER SUT TROCAR 14GX15 (MISCELLANEOUS) IMPLANT
IRRIGATION STRYKERFLOW (MISCELLANEOUS) IMPLANT
IRRIGATOR STRYKERFLOW (MISCELLANEOUS)
IV NS 1000ML (IV SOLUTION)
IV NS 1000ML BAXH (IV SOLUTION) IMPLANT
L-HOOK LAP DISP 36CM (ELECTROSURGICAL) ×3
LABEL OR SOLS (LABEL) ×3 IMPLANT
LHOOK LAP DISP 36CM (ELECTROSURGICAL) ×1 IMPLANT
NEEDLE HYPO 25X1 1.5 SAFETY (NEEDLE) ×3 IMPLANT
NEEDLE VERESS 14GA 120MM (NEEDLE) ×3 IMPLANT
NS IRRIG 500ML POUR BTL (IV SOLUTION) ×3 IMPLANT
PACK LAP CHOLECYSTECTOMY (MISCELLANEOUS) ×3 IMPLANT
PENCIL ELECTRO HAND CTR (MISCELLANEOUS) ×3 IMPLANT
POUCH ENDO CATCH 10MM SPEC (MISCELLANEOUS) ×3 IMPLANT
SCISSORS METZENBAUM CVD 33 (INSTRUMENTS) ×3 IMPLANT
SLEEVE ENDOPATH XCEL 5M (ENDOMECHANICALS) ×6 IMPLANT
STRIP CLOSURE SKIN 1/2X4 (GAUZE/BANDAGES/DRESSINGS) IMPLANT
SUT ETHILON 3-0 (SUTURE) ×3 IMPLANT
SUT MNCRL 4-0 (SUTURE) ×2
SUT MNCRL 4-0 27XMFL (SUTURE) ×1
SUT VICRYL 0 AB UR-6 (SUTURE) IMPLANT
SUTURE MNCRL 4-0 27XMF (SUTURE) ×1 IMPLANT
TROCAR XCEL 12X100 BLDLESS (ENDOMECHANICALS) ×3 IMPLANT
TROCAR XCEL NON-BLD 5MMX100MML (ENDOMECHANICALS) ×3 IMPLANT
TUBING INSUFFLATOR HI FLOW (MISCELLANEOUS) ×3 IMPLANT

## 2016-11-22 NOTE — Consult Note (Signed)
Cardiology Consult    Patient ID: Sherri Banks MRN: [id number], DOB/AGE: Nov 07, 1970   Admit date: 11/21/2016 Date of Consult: 11/22/2016  Primary Physician: [facility] Primary Cardiologist: New - seen by M. Kirke Corin, MD  Requesting Provider: Shela Commons. Earlene Plater, MD  Patient Profile    Sherri Banks is a 46 y.o. female with a history of HTN and GERD, who is being seen today for the evaluation of abnormal ECG at the request of Dr. Earlene Plater.  Past Medical History   Past Medical History:  Diagnosis Date  . Drug-induced gastric ulcer    NSAIDs  . GERD (gastroesophageal reflux disease)   . Hypertension   . Narcotic addiction (HCC)    a. uses subutex.    Past Surgical History:  Procedure Laterality Date  . BREAST REDUCTION SURGERY    . CESAREAN SECTION       Allergies  Allergies  Allergen Reactions  . Ciprofloxacin Diarrhea  . Ibuprofen Other (See Comments)    Causes stomach ulcers.    History of Present Illness    46 y/o ? without prior cardiac history.  She does have a h/o HTN, GERD, PUD (r/t nsaid usge), and narcotic addiction.  She was in her usual state of health until 7/13, when she began to experience ruq and epigastric discomfort.  She called EMS and reported chest pain. She was taken to the [facility] ED where ECG was non-acute and troponin was normal.  D dimer was elevated and CTA was performed and was neg for PE. ? Mild pulm edema.  No coronary Ca2+ was reported.  Cholelithiasis and gb sludge was noted with suggestion of mild pericholecystic edema.  BP was elevated.  Hospital admission was advised however she had to leave to take care of her 11 y/o son.  She was discharged but returned on 7/15 with worsening RUQ pain.  She was seen by surgery and admitted with plan for cholecystectomy.  Cardiology has been consulted in the setting of abnl ecg with suggestion of prior lateral infarct on 7/14 ecg.  Repeat is pending.  Pt denies any h/o exertional chest pain or dyspnea.   She lives on the second floor and thus routinely walks up stairs w/o issues.  She is able to carry out ADL's and grocery shopping without dyspnea/limitations.  She continues to experience RUQ discomfort that is worse with deep breathing and palpation.  Inpatient Medications    . enoxaparin (LOVENOX) injection  40 mg Subcutaneous Q24H  . fentaNYL (SUBLIMAZE) injection  100 mcg Intravenous Once    Family History    Both parents are alive and well in their 22's.  She has a brother that is alive and well.  No FH of premature CAD.  Social History    Social History   Social History  . Marital status: Married    Spouse name: N/A  . Number of children: N/A  . Years of education: N/A   Occupational History  .      works in Designer, fashion/clothing - sedentary job.   Social History Main Topics  . Smoking status: Never Smoker  . Smokeless tobacco: Never Used  . Alcohol use No  . Drug use: No     Comment: previously addicted to Rx narcotics - uses subutex x 6 yrs.  . Sexual activity: Not on file   Other Topics Concern  . Not on file   Social History Narrative   Lives in [place] with son.  Does not  routinely exercise but can perform routine activities w/o symptoms or limitations.     Review of Systems    General:  No chills, fever, night sweats or weight changes.  Cardiovascular:  No chest pain, dyspnea on exertion, edema, orthopnea, palpitations, paroxysmal nocturnal dyspnea. Dermatological: No rash, lesions/masses Respiratory: No cough, dyspnea Urologic: No hematuria, dysuria Abdominal:   +++ RUQ pain, no nausea, vomiting, diarrhea, bright red blood per rectum, melena, or hematemesis Neurologic:  No visual changes, wkns, changes in mental status. All other systems reviewed and are otherwise negative except as noted above.  Physical Exam    Blood pressure (!) 141/100, pulse (!) 110, temperature 98.2 F (36.8 C), temperature source Oral, resp. rate 18, height 5\' 6"  (1.676 m), weight  210 lb (95.3 kg), SpO2 97 %.  General: Pleasant, NAD Psych: Normal affect. Neuro: Alert and oriented X 3. Moves all extremities spontaneously. HEENT: Normal  Neck: Supple without bruits or JVD. Lungs:  Resp regular and unlabored, CTA. Heart: RRR no s3, s4, or murmurs. Abdomen: Soft, + RUQ tenderness, non-distended, BS + x 4.  Extremities: No clubbing, cyanosis or edema. DP/PT/Radials 2+ and equal bilaterally.  Labs    Recent Labs  11/20/16 0522  TROPONINI <0.03   Lab Results  Component Value Date   WBC 12.5 (H) 11/22/2016   HGB 12.0 11/22/2016   HCT 36.0 11/22/2016   MCV 88.6 11/22/2016   PLT 231 11/22/2016     Recent Labs Lab 11/22/16 0406  NA 136  K 3.5  CL 101  CO2 28  BUN 10  CREATININE 0.82  CALCIUM 8.7*  PROT 7.3  BILITOT 0.5  ALKPHOS 57  ALT 18  AST 26  GLUCOSE 140*    Radiology Studies    Dg Chest 2 View  Result Date: 11/20/2016 CLINICAL DATA:  Centralized chest pain beginning yesterday night. History of hypertension. EXAM: CHEST  2 VIEW COMPARISON:  Chest radiograph Oct 01, 2012 FINDINGS: The cardiac silhouette is mildly enlarged. Mediastinal silhouette is nonsuspicious. Pulmonary vascular congestion without pleural effusion or focal consolidation. No pneumothorax. Soft tissue planes and included osseous structures are nonsuspicious. IMPRESSION: Mild cardiomegaly and pulmonary vascular congestion. Electronically Signed   By: Awilda Metro M.D.   On: 11/20/2016 05:44   Ct Angio Chest Pe W/cm &/or Wo Cm  Result Date: 11/20/2016 CLINICAL DATA:  Chest pain and shortness of breath since 10 p.m. last night. EXAM: CT ANGIOGRAPHY CHEST WITH CONTRAST TECHNIQUE: Multidetector CT imaging of the chest was performed using the standard protocol during bolus administration of intravenous contrast. Multiplanar CT image reconstructions and MIPs were obtained to evaluate the vascular anatomy. CONTRAST:  75 cc Isovue 370 COMPARISON:  Chest radiographs obtained earlier  today. FINDINGS: Cardiovascular: Satisfactory opacification of the pulmonary arteries to the segmental level. No evidence of pulmonary embolism. Normal heart size. No pericardial effusion. Mediastinum/Nodes: No enlarged mediastinal, hilar, or axillary lymph nodes. Thyroid gland, trachea, and esophagus demonstrate no significant findings. Lungs/Pleura: Mildly prominent interstitial markings with mild bullous changes. No pleural fluid. Upper Abdomen: Probable sludge and noncalcified gallstones in the gallbladder. Suggestion of mild pericholecystic edema. Musculoskeletal: Mild thoracic and lower cervical spine degenerative changes. Review of the MIP images confirms the above findings. IMPRESSION: 1. No pulmonary emboli. 2. Mild changes of COPD with possible mild superimposed interstitial pulmonary edema. 3. Probable cholelithiasis and sludge in the gallbladder with a suggestion of mild pericholecystic edema. Cholecystic edema can be seen with cholecystitis. If this is a clinical concern, a right upper quadrant  abdomen ultrasound would be recommended. Emphysema (ICD10-J43.9). Electronically Signed   By: Beckie SaltsSteven  Reid M.D.   On: 11/20/2016 08:35   [place] Abdomen Limited Ruq  Result Date: 11/21/2016 CLINICAL DATA:  Right upper quadrant abdominal pain. Progression since yesterday. EXAM: ULTRASOUND ABDOMEN LIMITED RIGHT UPPER QUADRANT COMPARISON:  Right upper quadrant ultrasound yesterday. FINDINGS: Gallbladder: Gallbladder wall thickening has progressed, now measuring 7 mm. Numerous shadowing gallstones and sludge are present. Pericholecystic fluid is present. A sonographic Murphy's sign is noted by the sonographer. Common bile duct: Diameter: 3.0 mm, within normal limits. Liver: Echogenicity is mildly increased, similar to the prior study. No discrete lesions are present. IMPRESSION: 1. Progressive gallbladder wall thickening. A sonographic Eulah PontMurphy sign is now present. In the setting of numerous gallstones, the studies neck  diagnostic of acute cholecystitis. 2. Diffuse hepatic steatosis. Electronically Signed   By: Marin Robertshristopher  Mattern M.D.   On: 11/21/2016 13:31   [place] Abdomen Limited Ruq  Result Date: 11/20/2016 CLINICAL DATA:  Right upper quadrant pain since last night EXAM: ULTRASOUND ABDOMEN LIMITED RIGHT UPPER QUADRANT COMPARISON:  None. FINDINGS: Gallbladder: Multiple gallstones are present. The largest is 5 mm. There is gallbladder sludge. No wall thickening or Murphy's sign. Common bile duct: Diameter: 4 mm. Liver: The liver is diffusely increased in echogenicity without focal mass. Color Doppler imaging demonstrates hepatopetal flow in the portal vein with patency. IMPRESSION: Cholelithiasis. No evidence of acute cholecystitis. No evidence of biliary dilatation. Diffuse hepatic steatosis. Electronically Signed   By: Jolaine ClickArthur  Hoss M.D.   On: 11/20/2016 10:00    ECG & Cardiac Imaging    RSR, 98, lateral q's.  Assessment & Plan    1.  Acute cholecystitis/Preoperative cardiovascular evaluation:  Pt presented 7/14 and again 7/15 with RUQ and epigastric pain and found to have cholelithiasis and cholecystitis.  Now pending surgery.  Cardiology consulted due to lateral q's noted on ecg 7/14.  She denies any prior h/o chest pain or exertional dyspnea.  She does not routinely exercise but is able to perform ADL's, housework, grocery shopping, etc, without symptoms or limitations.  Recent CTA chest did not show evidence of coronary artery calcification.  F/u ecg pending.  Suspect changes may be related to lead placement and she should be able to go to surgery without further cardiac eval - further recs pending repeat 12 lead.  2.  Essential HTN:  bp elevated in the setting of abd pain.  Home meds on hold.  I will resume  blocker.  Signed, Nicolasa Duckinghristopher Kynnadi Dicenso, NP 11/22/2016, 9:39 AM

## 2016-11-22 NOTE — Anesthesia Procedure Notes (Signed)
Procedure Name: Intubation Date/Time: 11/22/2016 2:24 PM Performed by: Letitia Neri Pre-anesthesia Checklist: Patient identified, Emergency Drugs available, Suction available, Patient being monitored and Timeout performed Patient Re-evaluated:Patient Re-evaluated prior to induction Oxygen Delivery Method: Circle system utilized Preoxygenation: Pre-oxygenation with 100% oxygen Induction Type: IV induction Ventilation: Mask ventilation without difficulty Laryngoscope Size: Mac and 3 Grade View: Grade I Tube type: Oral Tube size: 7.0 mm Number of attempts: 1 Placement Confirmation: ETT inserted through vocal cords under direct vision,  positive ETCO2 and breath sounds checked- equal and bilateral Secured at: 22 cm Tube secured with: Tape Dental Injury: Teeth and Oropharynx as per pre-operative assessment

## 2016-11-22 NOTE — Progress Notes (Signed)
CC: Abdominal pain Subjective: Patient continues to complain of abdominal pain. States this is same pain that brought her into the has not gone any better since admission. Has been seen by cardiology.  Objective: Vital signs in last 24 hours: Temp:  [97.8 F (36.6 C)-98.7 F (37.1 C)] 97.8 F (36.6 C) (07/16 1239) Pulse Rate:  [98-116] 116 (07/16 1239) Resp:  [14-20] 20 (07/16 1239) BP: (108-141)/(68-100) 108/78 (07/16 1239) SpO2:  [91 %-97 %] 91 % (07/16 1239) Last BM Date: 11/18/16  Intake/Output from previous day: 07/15 0701 - 07/16 0700 In: 937 [I.V.:937] Out: 0  Intake/Output this shift: Total I/O In: 229.1 [I.V.:229.1] Out: -   Physical exam:  Gen.: No acute distress  chest: Clear to auscultation Heart: Tachycardic Abdomen: Soft, nondistended, tender to palpation in the right upper quadrant.  Lab Results: CBC   Recent Labs  11/21/16 1205 11/22/16 0406  WBC 12.9* 12.5*  HGB 14.0 12.0  HCT 40.6 36.0  PLT 276 231   BMET  Recent Labs  11/21/16 1205 11/22/16 0406  NA 138 136  K 3.8 3.5  CL 99* 101  CO2 31 28  GLUCOSE 119* 140*  BUN 11 10  CREATININE 0.89 0.82  CALCIUM 9.4 8.7*   PT/INR No results for input(s): LABPROT, INR in the last 72 hours. ABG No results for input(s): PHART, HCO3 in the last 72 hours.  Invalid input(s): PCO2, PO2  Studies/Results: [place] Abdomen Limited Ruq  Result Date: 11/21/2016 CLINICAL DATA:  Right upper quadrant abdominal pain. Progression since yesterday. EXAM: ULTRASOUND ABDOMEN LIMITED RIGHT UPPER QUADRANT COMPARISON:  Right upper quadrant ultrasound yesterday. FINDINGS: Gallbladder: Gallbladder wall thickening has progressed, now measuring 7 mm. Numerous shadowing gallstones and sludge are present. Pericholecystic fluid is present. A sonographic Murphy's sign is noted by the sonographer. Common bile duct: Diameter: 3.0 mm, within normal limits. Liver: Echogenicity is mildly increased, similar to the prior study. No  discrete lesions are present. IMPRESSION: 1. Progressive gallbladder wall thickening. A sonographic Eulah PontMurphy sign is now present. In the setting of numerous gallstones, the studies neck diagnostic of acute cholecystitis. 2. Diffuse hepatic steatosis. Electronically Signed   By: Marin Robertshristopher  Mattern M.D.   On: 11/21/2016 13:31    Anti-infectives: Anti-infectives    Start     Dose/Rate Route Frequency Ordered Stop   11/22/16 0600  cefTRIAXone (ROCEPHIN) 2 g in dextrose 5 % 50 mL IVPB  Status:  Discontinued     2 g 100 mL/hr over 30 Minutes Intravenous On call to O.R. 11/21/16 1507 11/21/16 1606   11/21/16 1800  cefTRIAXone (ROCEPHIN) 2 g in dextrose 5 % 50 mL IVPB     2 g 100 mL/hr over 30 Minutes Intravenous Every 24 hours 11/21/16 1606     11/21/16 1515  metroNIDAZOLE (FLAGYL) IVPB 500 mg  Status:  Discontinued     500 mg 100 mL/hr over 60 Minutes Intravenous Every 8 hours 11/21/16 1507 11/21/16 1606      Assessment/Plan:  46 year old female with acute cholecystitis. Appreciate cardiology and anesthesia and put in this patient's care. Discussed the patient that as soon as we have appropriate clearance from both anesthesia and cardiology we will proceed to the operating room for laparoscopic cholecystectomy. Patient voiced understanding and desires to proceed to surgery soon as possible.  Karlei Waldo T. Tonita CongWoodham, MD, [facility] HospitalFACS General Surgeon [facility]  Day [facility] [phone](7a-7p) [phone] Night [facility] [phone](7p-7a) [phone] 11/22/2016

## 2016-11-22 NOTE — Anesthesia Preprocedure Evaluation (Signed)
Anesthesia Evaluation  Patient identified by MRN, date of birth, ID band Patient awake    Reviewed: Allergy & Precautions, NPO status , Patient's Chart, lab work & pertinent test results  History of Anesthesia Complications Negative for: history of anesthetic complications  Airway Mallampati: III  TM Distance: >3 FB Neck ROM: Full    Dental no notable dental hx.    Pulmonary neg pulmonary ROS, neg sleep apnea, neg COPD,    breath sounds clear to auscultation- rhonchi (-) wheezing      Cardiovascular hypertension, Pt. on medications (-) CAD, (-) Past MI and (-) Cardiac Stents  Rhythm:Regular Rate:Normal - Systolic murmurs and - Diastolic murmurs    Neuro/Psych negative neurological ROS  negative psych ROS   GI/Hepatic Neg liver ROS, PUD, GERD  ,  Endo/Other  negative endocrine ROSneg diabetes  Renal/GU negative Renal ROS     Musculoskeletal negative musculoskeletal ROS (+)   Abdominal (+) + obese,   Peds  Hematology negative hematology ROS (+)   Anesthesia Other Findings Past Medical History: No date: Drug-induced gastric ulcer     Comment:  NSAIDs No date: GERD (gastroesophageal reflux disease) No date: Hypertension No date: Narcotic addiction (HCC)     Comment:  a. uses subutex.   Reproductive/Obstetrics                             Anesthesia Physical Anesthesia Plan  ASA: II  Anesthesia Plan: General   Post-op Pain Management:    Induction: Intravenous  PONV Risk Score and Plan: 2 and Ondansetron and Dexamethasone  Airway Management Planned: Oral ETT  Additional Equipment:   Intra-op Plan:   Post-operative Plan: Extubation in OR  Informed Consent: I have reviewed the patients History and Physical, chart, labs and discussed the procedure including the risks, benefits and alternatives for the proposed anesthesia with the patient or authorized representative who has  indicated his/her understanding and acceptance.   Dental advisory given  Plan Discussed with: CRNA and Anesthesiologist  Anesthesia Plan Comments:         Anesthesia Quick Evaluation

## 2016-11-22 NOTE — Brief Op Note (Signed)
11/21/2016 - 11/22/2016  4:23 PM  PATIENT:  Sherri CrouchLatoya E Banks  46 y.o. female  PRE-OPERATIVE DIAGNOSIS:  Acute Cholecystitis  POST-OPERATIVE DIAGNOSIS:  Same  PROCEDURE:  Procedure(s): LAPAROSCOPIC CHOLECYSTECTOMY (N/A)  SURGEON:  Surgeon(s) and Role:    Ricarda Frame* Shylah Dossantos, MD - Primary  PHYSICIAN ASSISTANT:   ASSISTANTS: PA Student   ANESTHESIA:   general  EBL:  Total I/O In: 1429.1 [I.V.:1429.1] Out: 250 [Blood:250]  BLOOD ADMINISTERED:none  DRAINS: (19 fr) Blake drain(s) in the perihepatic space   LOCAL MEDICATIONS USED:  MARCAINE    and XYLOCAINE   SPECIMEN:  Source of Specimen:  gallbladder  DISPOSITION OF SPECIMEN:  PATHOLOGY  COUNTS:  YES  TOURNIQUET:  * No tourniquets in log *  DICTATION: .Dragon Dictation  PLAN OF CARE: return to inpatient status  PATIENT DISPOSITION:  PACU - hemodynamically stable.   Delay start of Pharmacological VTE agent (>24hrs) due to surgical blood loss or risk of bleeding: yes

## 2016-11-22 NOTE — Transfer of Care (Signed)
Immediate Anesthesia Transfer of Care Note  Patient: Neil CrouchLatoya E Barriere  Procedure(s) Performed: Procedure(s): LAPAROSCOPIC CHOLECYSTECTOMY (N/A)  Patient Location: PACU  Anesthesia Type:General  Level of Consciousness: awake and alert   Airway & Oxygen Therapy: Patient Spontanous Breathing and Patient connected to face mask oxygen  Post-op Assessment: Report given to RN and Post -op Vital signs reviewed and stable  Post vital signs: Reviewed and stable  Last Vitals:  Vitals:   11/22/16 0849 11/22/16 1239  BP:  108/78  Pulse:  (!) 116  Resp:  20  Temp: 36.8 C 36.6 C    Last Pain:  Vitals:   11/22/16 1239  TempSrc: Oral  PainSc:       Patients Stated Pain Goal: 0 (11/22/16 0825)  Complications: No apparent anesthesia complications

## 2016-11-22 NOTE — Op Note (Signed)
Laparoscopic Cholecystectomy  Pre-operative Diagnosis: Acute cholecystitis  Post-operative Diagnosis: Acute cholecystitis  Procedure: Laparoscopic cholecystectomy  Surgeon: Leonette Mostharles T. Tonita CongWoodham, MD FACS  Anesthesia: Gen. with endotracheal tube  Assistant: PA student  Procedure Details  The patient was seen again in the Holding Room. The benefits, complications, treatment options, and expected outcomes were discussed with the patient. The risks of bleeding, infection, recurrence of symptoms, failure to resolve symptoms, bile duct damage, bile duct leak, retained common bile duct stone, bowel injury, any of which could require further surgery and/or ERCP, stent, or papillotomy were reviewed with the patient. The likelihood of improving the patient's symptoms with return to their baseline status is good.  The patient and/or family concurred with the proposed plan, giving informed consent.  The patient was taken to Operating Room, identified as Sherri Banks and the procedure verified as Laparoscopic Cholecystectomy.  A Time Out was held and the above information confirmed.  Prior to the induction of general anesthesia, antibiotic prophylaxis was administered. VTE prophylaxis was in place. General endotracheal anesthesia was then administered and tolerated well. After the induction, the abdomen was prepped with Chloraprep and draped in the sterile fashion. The patient was positioned in the supine position.  The patient was noted to have scarring of the umbilicus from her prior abdominal surgeries. Local anesthetic was injected into the skin near 2 fingerbreadths below the costal margin in the right midclavicular line and an incision made. The Veress needle was placed. Pneumoperitoneum was then created with CO2 and tolerated well without any adverse changes in the patient's vital signs. A 5mm port was placed in incision site and the abdominal cavity was explored. There was evidence of adhesions to the  upper abdominal wall just below the umbilicus.  Two 5-mm ports were placed, one in the periumbilical and an additional in the right upper quadrant and a 12 mm epigastric port was placed all under direct vision. All skin incisions  were infiltrated with a local anesthetic agent before making the incision and placing the trocars.   The patient was positioned  in reverse Trendelenburg, tilted slightly to the patient's left. Due to the patient's body habitus there was limited space for operating. The gallbladder was identified, it was initially unable be grasped due to being tensely distended. Using a decompression needle approximately 60 mL of purulent fluid was removed from the gallbladder. This allowed the fundus to be grasped and retracted cephalad. Adhesions were lysed bluntly and with electrocautery. Again, due to the body habitus and limited operative space visualization was difficult. Therefore, an additional 5 mm trocar was placed in the right lower quadrant under direct visualization. This allowed a peer retractor to be used for downward distraction of the proximal small bowel. The infundibulum was finally able to be exposed and grasped and retracted laterally, exposing the peritoneum overlying the triangle of Calot. This was then divided and exposed in a blunt fashion. A critical view of the cystic duct and cystic artery was obtained.  The cystic duct was clearly identified and bluntly dissected.   The duct and artery were serially clipped and cut in between with endoscopic shears. Throughout the dissection there was difficult to control oozing from a torn area of the liver lateral to the gallbladder. This was eventually controlled with electrocautery after the duct and artery were clipped and controlled.  The gallbladder was taken from the gallbladder fossa in a retrograde fashion with the electrocautery. Approximately halfway up the posterior wall the gallbladder, it was entered  into with  electrocautery. Purulent fluid was expressed from the gallbladder as well as numerous stones. The gallbladder was removed and placed in an Endocatch bag. The liver bed was irrigated and inspected. Hemostasis was achieved with the electrocautery. Copious irrigation was utilized and was repeatedly aspirated until clear. The spilled gallstones that were visible were retrieved with the stone grasper.  The gallbladder and Endocatch sac were then removed through the epigastric port site.   The epigastric port site had to be sharply and bluntly enlarged in order for the very large gallbladder to be removed. Inspection of the right upper quadrant was performed. No bleeding, bile duct injury or leak, or bowel injury was noted. However, given the difficult nature of the dissection the decision was made to place a 19 [place] round Blake drain into the abdomen. It was inserted through the epigastric port site and removed through the most lateral right upper quadrant site. He was visualized going into the perihepatic space. It was secured to the skin with a 3-0 nylon suture. Pneumoperitoneum was released.  The epigastric port site was closed with figure-of-eight 0 Vicryl sutures. 4-0 subcuticular Monocryl was used to close the skin. Steristrips and Mastisol and sterile dressings were  applied.  The patient was then extubated and brought to the recovery room in stable condition. Sponge, lap, and needle counts were correct at closure and at the conclusion of the case.   Findings: Acute Cholecystitis   Estimated Blood Loss: 250 mL         Drains: 19 French round Blake         Specimens: Gallbladder           Complications: none               Sherri Groseclose T. Tonita Cong, MD, FACS

## 2016-11-22 NOTE — Anesthesia Preprocedure Evaluation (Deleted)
Anesthesia Evaluation  Patient identified by MRN, date of birth, ID band Patient awake    Reviewed: Allergy & Precautions, H&P , NPO status , Patient's Chart, lab work & pertinent test results, reviewed documented beta blocker date and time   Airway Mallampati: II  TM Distance: >3 FB Neck ROM: full    Dental  (+) Teeth Intact   Pulmonary neg pulmonary ROS,    Pulmonary exam normal        Cardiovascular hypertension, negative cardio ROS Normal cardiovascular exam Rhythm:regular Rate:Normal     Neuro/Psych negative neurological ROS  negative psych ROS   GI/Hepatic negative GI ROS, Neg liver ROS, PUD, GERD  Medicated,  Endo/Other  negative endocrine ROS  Renal/GU negative Renal ROS  negative genitourinary   Musculoskeletal   Abdominal   Peds  Hematology negative hematology ROS (+)   Anesthesia Other Findings Past Medical History: No date: Drug-induced gastric ulcer     Comment:  NSAIDs No date: GERD (gastroesophageal reflux disease) No date: Hypertension No date: Narcotic addiction (HCC)     Comment:  a. uses subutex. Past Surgical History: No date: BREAST REDUCTION SURGERY No date: CESAREAN SECTION BMI    Body Mass Index:  33.89 kg/m     Reproductive/Obstetrics negative OB ROS                             Anesthesia Physical Anesthesia Plan  ASA: III and emergent  Anesthesia Plan: General ETT   Post-op Pain Management:    Induction:   PONV Risk Score and Plan: 4 or greater and Ondansetron, Dexamethasone, Propofol and Midazolam  Airway Management Planned:   Additional Equipment:   Intra-op Plan:   Post-operative Plan:   Informed Consent: I have reviewed the patients History and Physical, chart, labs and discussed the procedure including the risks, benefits and alternatives for the proposed anesthesia with the patient or authorized representative who has indicated  his/her understanding and acceptance.   Dental Advisory Given  Plan Discussed with: CRNA  Anesthesia Plan Comments:         Anesthesia Quick Evaluation

## 2016-11-22 NOTE — Anesthesia Post-op Follow-up Note (Signed)
Anesthesia QCDR form completed.        

## 2016-11-23 ENCOUNTER — Encounter: Payer: Self-pay | Admitting: General Surgery

## 2016-11-23 LAB — CBC
HCT: 37.6 % (ref 35.0–47.0)
HEMOGLOBIN: 12.3 g/dL (ref 12.0–16.0)
MCH: 29.1 pg (ref 26.0–34.0)
MCHC: 32.8 g/dL (ref 32.0–36.0)
MCV: 88.9 fL (ref 80.0–100.0)
Platelets: 248 10*3/uL (ref 150–440)
RBC: 4.23 MIL/uL (ref 3.80–5.20)
RDW: 13.8 % (ref 11.5–14.5)
WBC: 20 10*3/uL — AB (ref 3.6–11.0)

## 2016-11-23 LAB — COMPREHENSIVE METABOLIC PANEL
ALT: 35 U/L (ref 14–54)
ANION GAP: 8 (ref 5–15)
AST: 56 U/L — ABNORMAL HIGH (ref 15–41)
Albumin: 3.4 g/dL — ABNORMAL LOW (ref 3.5–5.0)
Alkaline Phosphatase: 83 U/L (ref 38–126)
BILIRUBIN TOTAL: 0.3 mg/dL (ref 0.3–1.2)
BUN: 10 mg/dL (ref 6–20)
CALCIUM: 8.9 mg/dL (ref 8.9–10.3)
CO2: 25 mmol/L (ref 22–32)
Chloride: 102 mmol/L (ref 101–111)
Creatinine, Ser: 0.99 mg/dL (ref 0.44–1.00)
GLUCOSE: 139 mg/dL — AB (ref 65–99)
Potassium: 4.2 mmol/L (ref 3.5–5.1)
Sodium: 135 mmol/L (ref 135–145)
TOTAL PROTEIN: 7.7 g/dL (ref 6.5–8.1)

## 2016-11-23 LAB — HIV ANTIBODY (ROUTINE TESTING W REFLEX): HIV SCREEN 4TH GENERATION: NONREACTIVE

## 2016-11-23 MED ORDER — OXYCODONE-ACETAMINOPHEN 7.5-325 MG PO TABS
2.0000 | ORAL_TABLET | ORAL | Status: DC | PRN
  Administered 2016-11-23 – 2016-11-24 (×2): 2 via ORAL
  Filled 2016-11-23 (×2): qty 2

## 2016-11-23 MED ORDER — KETOROLAC TROMETHAMINE 30 MG/ML IJ SOLN
30.0000 mg | Freq: Four times a day (QID) | INTRAMUSCULAR | Status: DC
  Administered 2016-11-23 – 2016-11-24 (×4): 30 mg via INTRAVENOUS
  Filled 2016-11-23 (×4): qty 1

## 2016-11-23 MED ORDER — VENLAFAXINE HCL ER 75 MG PO CP24
300.0000 mg | ORAL_CAPSULE | Freq: Every day | ORAL | Status: DC
  Administered 2016-11-23 – 2016-11-24 (×2): 300 mg via ORAL
  Filled 2016-11-23 (×2): qty 4

## 2016-11-23 NOTE — Progress Notes (Signed)
1 Day Post-Op   Subjective:  Patient doing well 1 day status post laparoscopic cholecystectomy. Continues to complain of pain but is otherwise tolerating a diet.  Vital signs in last 24 hours: Temp:  [97.6 F (36.4 C)-99 F (37.2 C)] 97.9 F (36.6 C) (07/17 1205) Pulse Rate:  [87-106] 95 (07/17 1205) Resp:  [12-20] 12 (07/17 0357) BP: (116-142)/(64-83) 116/68 (07/17 1205) SpO2:  [92 %-100 %] 100 % (07/17 1205) Last BM Date: 11/18/16  Intake/Output from previous day: 07/16 0701 - 07/17 0700 In: 2605.2 [I.V.:2525.2; IV Piggyback:30] Out: 1680 [Urine:1300; Drains:130; Blood:250]  GI: Abdomen soft, tender to palpation in the right upper quadrant, nondistended. JP drain in the right upper quadrant draining a serosanguineous output.  Lab Results:  CBC  Recent Labs  11/22/16 0406 11/23/16 1005  WBC 12.5* 20.0*  HGB 12.0 12.3  HCT 36.0 37.6  PLT 231 248   CMP     Component Value Date/Time   NA 135 11/23/2016 0741   NA 136 05/29/2013 1011   K 4.2 11/23/2016 0741   K 4.0 05/29/2013 1011   CL 102 11/23/2016 0741   CL 99 05/29/2013 1011   CO2 25 11/23/2016 0741   CO2 27 05/29/2013 1011   GLUCOSE 139 (H) 11/23/2016 0741   GLUCOSE 89 05/29/2013 1011   BUN 10 11/23/2016 0741   BUN 8 05/29/2013 1011   CREATININE 0.99 11/23/2016 0741   CREATININE 0.99 05/29/2013 1011   CALCIUM 8.9 11/23/2016 0741   CALCIUM 9.7 05/29/2013 1011   PROT 7.7 11/23/2016 0741   PROT 8.9 (H) 05/29/2013 1011   ALBUMIN 3.4 (L) 11/23/2016 0741   ALBUMIN 3.9 05/29/2013 1011   AST 56 (H) 11/23/2016 0741   AST 22 05/29/2013 1011   ALT 35 11/23/2016 0741   ALT 20 05/29/2013 1011   ALKPHOS 83 11/23/2016 0741   ALKPHOS 72 05/29/2013 1011   BILITOT 0.3 11/23/2016 0741   BILITOT 0.3 05/29/2013 1011   GFRNONAA >60 11/23/2016 0741   GFRNONAA >60 05/29/2013 1011   GFRAA >60 11/23/2016 0741   GFRAA >60 05/29/2013 1011   PT/INR No results for input(s): LABPROT, INR in the last 72  hours.  Studies/Results: No results found.  Assessment/Plan: [age] old female status post laparoscopic cholecystectomy for purulent cholecystitis. Continues to have an elevated white blood cell count. Plan to continue IV antibiotics until her white blood cell count normalizes. Encourage ambulation, incentive spirometer usage. Will work towards adequate pain control.   Ricarda Frameharles Joanann Mies, MD [employer]ACS General Surgeon [facility]  Day [facility] [phone](7a-7p) [phone] Night [facility] [phone](7p-7a) [phone]  11/23/2016

## 2016-11-23 NOTE — Anesthesia Postprocedure Evaluation (Signed)
Anesthesia Post Note  Patient: Sherri CrouchLatoya E Banks  Procedure(s) Performed: Procedure(s) (LRB): LAPAROSCOPIC CHOLECYSTECTOMY (N/A)  Patient location during evaluation: PACU Anesthesia Type: General Level of consciousness: awake and alert and oriented Pain management: pain level controlled Vital Signs Assessment: post-procedure vital signs reviewed and stable Respiratory status: spontaneous breathing, nonlabored ventilation and respiratory function stable Cardiovascular status: blood pressure returned to baseline and stable Postop Assessment: no signs of nausea or vomiting Anesthetic complications: no     Last Vitals:  Vitals:   11/22/16 2208 11/23/16 0357  BP: 122/64 124/77  Pulse: 92 87  Resp: 18 12  Temp: 36.4 C 37 C    Last Pain:  Vitals:   11/23/16 0620  TempSrc:   PainSc: 10-Worst pain ever                 Gianpaolo Mindel

## 2016-11-24 LAB — COMPREHENSIVE METABOLIC PANEL
ALT: 31 U/L (ref 14–54)
ANION GAP: 6 (ref 5–15)
AST: 38 U/L (ref 15–41)
Albumin: 2.9 g/dL — ABNORMAL LOW (ref 3.5–5.0)
Alkaline Phosphatase: 52 U/L (ref 38–126)
BUN: 16 mg/dL (ref 6–20)
CALCIUM: 8.5 mg/dL — AB (ref 8.9–10.3)
CHLORIDE: 101 mmol/L (ref 101–111)
CO2: 28 mmol/L (ref 22–32)
Creatinine, Ser: 0.91 mg/dL (ref 0.44–1.00)
GFR calc non Af Amer: >60 mL/min (ref >60)
Glucose, Bld: 116 mg/dL — ABNORMAL HIGH (ref 65–99)
Potassium: 4 mmol/L (ref 3.5–5.1)
SODIUM: 135 mmol/L (ref 135–145)
Total Bilirubin: 0.3 mg/dL (ref 0.3–1.2)
Total Protein: 7.3 g/dL (ref 6.5–8.1)

## 2016-11-24 LAB — CBC
HCT: 32.3 % — ABNORMAL LOW (ref 35.0–47.0)
Hemoglobin: 10.5 g/dL — ABNORMAL LOW (ref 12.0–16.0)
MCH: 29.1 pg (ref 26.0–34.0)
MCHC: 32.6 g/dL (ref 32.0–36.0)
MCV: 89.3 fL (ref 80.0–100.0)
PLATELETS: 271 10*3/uL (ref 150–440)
RBC: 3.61 MIL/uL — ABNORMAL LOW (ref 3.80–5.20)
RDW: 14 % (ref 11.5–14.5)
WBC: 14.9 10*3/uL — ABNORMAL HIGH (ref 3.6–11.0)

## 2016-11-24 LAB — SURGICAL PATHOLOGY

## 2016-11-24 MED ORDER — AMOXICILLIN-POT CLAVULANATE 875-125 MG PO TABS
1.0000 | ORAL_TABLET | Freq: Two times a day (BID) | ORAL | Status: DC
  Administered 2016-11-24: 1 via ORAL
  Filled 2016-11-24: qty 1

## 2016-11-24 MED ORDER — OXYCODONE-ACETAMINOPHEN 7.5-325 MG PO TABS: 2.0000 | ORAL_TABLET | ORAL | 0 refills | Status: DC | PRN

## 2016-11-24 MED ORDER — AMOXICILLIN-POT CLAVULANATE 875-125 MG PO TABS: 1.0000 | ORAL_TABLET | Freq: Two times a day (BID) | ORAL | 0 refills | Status: DC

## 2016-11-24 NOTE — Discharge Instructions (Signed)
Laparoscopic Cholecystectomy, Care After This sheet gives you information about how to care for yourself after your procedure. Your health care provider may also give you more specific instructions. If you have problems or questions, contact your health care provider. What can I expect after the procedure? After the procedure, it is common to have:  Pain at your incision sites. You will be given medicines to control this pain.  Mild nausea or vomiting.  Bloating and possible shoulder pain from the air-like gas that was used during the procedure.  Follow these instructions at home: Incision care   Follow instructions from your health care provider about how to take care of your incisions. Make sure you: ? Wash your hands with soap and water before you change your bandage (dressing). If soap and water are not available, use hand sanitizer. ? Change your dressing as told by your health care provider. ? Leave stitches (sutures), skin glue, or adhesive strips in place. These skin closures may need to be in place for 2 weeks or longer. If adhesive strip edges start to loosen and curl up, you may trim the loose edges. Do not remove adhesive strips completely unless your health care provider tells you to do that.  Do not take baths, swim, or use a hot tub until your health care provider approves. Ask your health care provider if you can take showers. You may only be allowed to take sponge baths for bathing.  Check your incision area every day for signs of infection. Check for: ? More redness, swelling, or pain. ? More fluid or blood. ? Warmth. ? Pus or a bad smell. ? Keep log of drain outputs until drain is removed. Call clinic immediately if the output turns green. Activity  Do not drive or use heavy machinery while taking prescription pain medicine.  Do not lift anything that is heavier than 10 lb (4.5 kg) until your health care provider approves.  Do not play contact sports until your  health care provider approves.  Do not drive for 24 hours if you were given a medicine to help you relax (sedative).  Rest as needed. Do not return to work or school until your health care provider approves. General instructions  Take over-the-counter and prescription medicines only as told by your health care provider.  To prevent or treat constipation while you are taking prescription pain medicine, your health care provider may recommend that you: ? Drink enough fluid to keep your urine clear or pale yellow. ? Take over-the-counter or prescription medicines. ? Eat foods that are high in fiber, such as fresh fruits and vegetables, whole grains, and beans. ? Limit foods that are high in fat and processed sugars, such as fried and sweet foods. Contact a health care provider if:  You develop a rash.  You have more redness, swelling, or pain around your incisions.  You have more fluid or blood coming from your incisions.  Your incisions feel warm to the touch.  You have pus or a bad smell coming from your incisions.  You have a fever.  One or more of your incisions breaks open. Get help right away if:  You have trouble breathing.  You have chest pain.  You have increasing pain in your shoulders.  You faint or feel dizzy when you stand.  You have severe pain in your abdomen.  You have nausea or vomiting that lasts for more than one day.  You have leg pain. This information is not intended  to replace advice given to you by your health care provider. Make sure you discuss any questions you have with your health care provider. Document Released: 04/26/2005 Document Revised: 11/15/2015 Document Reviewed: 10/13/2015 Elsevier Interactive Patient Education  2017 [employer].

## 2016-11-24 NOTE — Progress Notes (Signed)
2 Days Post-Op   Subjective:  Patient reports her pain has been much better controlled overnight. She has been tolerating a liquid diet and denies any nausea or vomiting.  Vital signs in last 24 hours: Temp:  [97.5 F (36.4 C)-97.9 F (36.6 C)] 97.5 F (36.4 C) (07/18 0438) Pulse Rate:  [80-96] 80 (07/18 0438) Resp:  [18] 18 (07/18 0438) BP: (110-118)/(55-68) 110/55 (07/18 0438) SpO2:  [93 %-100 %] 96 % (07/18 0438) Last BM Date: 11/23/16  Intake/Output from previous day: 07/17 0701 - 07/18 0700 In: 2413 [I.V.:2359; IV Piggyback:54] Out: 680 [Urine:600; Drains:80]  GI: Abdomen is soft, appropriately tender to palpation in the right upper quadrant, nondistended. JP drain in place draining serosanguineous fluid.  Lab Results:  CBC  Recent Labs  11/23/16 1005 11/24/16 0727  WBC 20.0* 14.9*  HGB 12.3 10.5*  HCT 37.6 32.3*  PLT 248 271   CMP     Component Value Date/Time   NA 135 11/24/2016 0727   NA 136 05/29/2013 1011   K 4.0 11/24/2016 0727   K 4.0 05/29/2013 1011   CL 101 11/24/2016 0727   CL 99 05/29/2013 1011   CO2 28 11/24/2016 0727   CO2 27 05/29/2013 1011   GLUCOSE 116 (H) 11/24/2016 0727   GLUCOSE 89 05/29/2013 1011   BUN 16 11/24/2016 0727   BUN 8 05/29/2013 1011   CREATININE 0.91 11/24/2016 0727   CREATININE 0.99 05/29/2013 1011   CALCIUM 8.5 (L) 11/24/2016 0727   CALCIUM 9.7 05/29/2013 1011   PROT 7.3 11/24/2016 0727   PROT 8.9 (H) 05/29/2013 1011   ALBUMIN 2.9 (L) 11/24/2016 0727   ALBUMIN 3.9 05/29/2013 1011   AST 38 11/24/2016 0727   AST 22 05/29/2013 1011   ALT 31 11/24/2016 0727   ALT 20 05/29/2013 1011   ALKPHOS 52 11/24/2016 0727   ALKPHOS 72 05/29/2013 1011   BILITOT 0.3 11/24/2016 0727   BILITOT 0.3 05/29/2013 1011   GFRNONAA >60 11/24/2016 0727   GFRNONAA >60 05/29/2013 1011   GFRAA >60 11/24/2016 0727   GFRAA >60 05/29/2013 1011   PT/INR No results for input(s): LABPROT, INR in the last 72 hours.  Studies/Results: No  results found.  Assessment/Plan: 46 year old female postop day #2 from a laparoscopic cholecystectomy for purulent cholecystitis. White blood cell count much improved today. Discussed transitioning to oral antibiotics today. Advance diet to regular. Encourage ambulation and incentive spirometer usage. If tolerates a regular diet, oral antibiotics, has pain control on oral medications will likely be ready for discharge home later today.   Ricarda Frameharles Tana Trefry, MD [facility] General Surgeon [facility]  Day [facility] [phone](7a-7p) [phone] Night [facility] [phone](7p-7a) [phone]  11/24/2016

## 2016-11-24 NOTE — Discharge Summary (Signed)
Patient ID: Sherri CrouchLatoya E Banks MRN: [id number] DOB/AGE: [birth year] 44 y.o.  Admit date: 11/21/2016 Discharge date: 11/24/2016  Discharge Diagnoses:  Cholecystitis  Procedures Performed: Laparoscopic Cholecystectomy  Discharged Condition: good  Hospital Course: Patient admitted with cholecystitis. Underwent a laparoscopic cholecystectomy that required a drain placement. Tolerated surgery well and was able to be transitioned to oral antibiotics and pain medications by the day of discharge.  Discharge Orders: Discharge Instructions    Call MD for:  difficulty breathing, headache or visual disturbances    Complete by:  As directed    Call MD for:  hives    Complete by:  As directed    Call MD for:  persistant nausea and vomiting    Complete by:  As directed    Call MD for:  redness, tenderness, or signs of infection (pain, swelling, redness, odor or green/yellow discharge around incision site)    Complete by:  As directed    Call MD for:  severe uncontrolled pain    Complete by:  As directed    Call MD for:  temperature >100.4    Complete by:  As directed    Diet - low sodium heart healthy    Complete by:  As directed    Increase activity slowly    Complete by:  As directed       Disposition: 07-Left Against Medical Advice/Left Without Being Seen/Elopement  Discharge Medications: Allergies as of 11/24/2016      Reactions   Ciprofloxacin Diarrhea   Ibuprofen Other (See Comments)   Causes stomach ulcers.      Medication List    TAKE these medications   amoxicillin-clavulanate 875-125 MG tablet Commonly known as:  AUGMENTIN Take 1 tablet by mouth every 12 (twelve) hours.   buprenorphine 2 MG Subl SL tablet Commonly known as:  SUBUTEX Place 2.5 mg under the tongue daily.   carvedilol 6.25 MG tablet Commonly known as:  COREG Take 1 tablet (6.25 mg total) by mouth 2 (two) times daily.   furosemide 20 MG tablet Commonly known as:  LASIX Take 1 tablet (20 mg total) by  mouth 2 (two) times daily.   oxyCODONE-acetaminophen 7.5-325 MG tablet Commonly known as:  PERCOCET Take 2 tablets by mouth every 4 (four) hours as needed for moderate pain or severe pain.   triamterene-hydrochlorothiazide 37.5-25 MG tablet Commonly known as:  MAXZIDE-25 Take 1 tablet by mouth daily.   venlafaxine XR 150 MG 24 hr capsule Commonly known as:  EFFEXOR XR Take 2 capsules (300 mg total) by mouth daily with breakfast.        Follwup: Follow-up Information    [facility] [place] Go in 1 week(s).   Specialty:  General Surgery Why:  Post op, needs drain removal Contact information: [address] [place] [id number] [phone]          Signed: Ricarda FrameCharles Encarnacion Bole 11/24/2016, 12:09 PM

## 2016-11-24 NOTE — Progress Notes (Signed)
Pt discharged to home. Paperwork reviewed with pt. Education on drain completed. Prescription given to pt. All questions answered to satisfaction. IV removed. Pt escorted down in wheelchair via volunteer.

## 2016-11-25 ENCOUNTER — Telehealth: Payer: Self-pay

## 2016-11-25 NOTE — Telephone Encounter (Signed)
Error

## 2016-11-26 LAB — BODY FLUID CULTURE: Culture: NO GROWTH

## 2016-11-29 ENCOUNTER — Other Ambulatory Visit: Payer: Self-pay

## 2016-11-29 DIAGNOSIS — M25569 Pain in unspecified knee: Secondary | ICD-10-CM | POA: Insufficient documentation

## 2016-11-29 HISTORY — DX: Pain in unspecified knee: M25.569

## 2016-12-01 ENCOUNTER — Encounter: Payer: Self-pay | Admitting: General Surgery

## 2016-12-01 ENCOUNTER — Ambulatory Visit (INDEPENDENT_AMBULATORY_CARE_PROVIDER_SITE_OTHER): Payer: 59 | Admitting: General Surgery

## 2016-12-01 VITALS — BP 138/92 | HR 101 | Temp 98.0°F | Ht 66.0 in | Wt 223.4 lb

## 2016-12-01 DIAGNOSIS — Z4889 Encounter for other specified surgical aftercare: Secondary | ICD-10-CM

## 2016-12-01 MED ORDER — OXYCODONE-ACETAMINOPHEN 7.5-325 MG PO TABS: 1.0000 | ORAL_TABLET | Freq: Four times a day (QID) | ORAL | 0 refills | Status: DC | PRN

## 2016-12-01 NOTE — Progress Notes (Signed)
Outpatient Surgical Follow Up  12/01/2016  Sherri CrouchLatoya E Banks is an 46 y.o. female.   Chief Complaint  Patient presents with  . Routine Post Op    Laparoscopic Cholecystectomy (11/22/16)- Dr. Tonita [facility]Woodham [facility](Drain in Place)    HPI: 46 year old female returns to clinic for follow-up 10 days status post laparoscopic cholecystectomy. She reports that her pain has been well controlled on oral medications are prescribed. She's been eating well and having normal bowel function. Her drain has been having a clear to pinkish output. She denies any fevers, chills, nausea, vomiting, chest pain, shortness breath, diarrhea, constipation.  Past Medical History:  Diagnosis Date  . Acute calculous cholecystitis 11/21/2016  . Bilateral edema of lower extremity 06/12/2015  . Contusion of knee 08/20/2016  . Drug-induced gastric ulcer    NSAIDs  . Essential hypertension 06/12/2015  . GERD (gastroesophageal reflux disease)   . Hypertension   . Knee pain 11/29/2016  . Narcotic addiction (HCC)    a. uses subutex.  . 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 BREAST REDUCTION SURGERY    . CESAREAN SECTION    . CHOLECYSTECTOMY N/A 11/22/2016   Procedure: LAPAROSCOPIC CHOLECYSTECTOMY;  Surgeon: Ricarda FrameWoodham, Emaly Boschert, MD;  Location: [facility] ORS;  Service: General;  Laterality: N/A;    History reviewed. No pertinent family history.  Social History:  reports that she has never smoked. She has never used smokeless tobacco. She reports that she does not drink alcohol or use drugs.  Allergies:  Allergies  Allergen Reactions  . Ciprofloxacin Diarrhea  . Ibuprofen Other (See Comments)    Causes stomach ulcers.  . Other     Medications reviewed.    ROS A multipoint review of systems was completed, all pertinent positives and negatives are documented within the history of present illness and remainder are negative   BP (!) 138/92   Pulse (!) 101   Temp 98 F  (36.7 C) (Oral)   Ht 5\' 6"  (1.676 m)   Wt 101.3 kg (223 lb 6.4 oz)   BMI 36.06 kg/m   Physical Exam  Gen.: No acute distress  chest: Clear to auscultation Heart: Regular rhythm Abdomen: Soft, appropriately tender to palpation the incision sites, nondistended. All incision sites well approximated without any evidence of erythema or drainage. JP drain in the right upper quadrant with a serous  output.   No results found for this or any previous visit (from the past 48 hour(s)). No results found.  Assessment/Plan:  1. Aftercare following surgery 46 year old female status post laparoscopic cholecystectomy. Pathology reviewed with the patient. Drain able be removed today without any difficulty. Patient has significant pain history and is involved with a pain clinic. Provided with a short-term prescription for more pain medication today. Discussed with the patient that she would not be getting more. We will have the patient follow up next week for additional wound check given the drain was removed today.     Ricarda Frameharles Romell Cavanah, MD FACS General Surgeon  12/01/2016,1:11 PM

## 2016-12-01 NOTE — Patient Instructions (Signed)
Please have your pain prescription given today filled at your pharmacy.  Please do not submerge in a tub, hot tub, or pool until incisions are completely sealed.  Use sun block to incision area over the next year if this area will be exposed to sun. This helps decrease scarring.  You may resume your normal activities on 01/03/17. At that time- Listen to your body when lifting, if you have pain when lifting, stop and then try again in a few days. Soreness after doing exercises or activities of daily living is normal as you get back in to your normal routine.  If you develop redness, drainage, or pain at incision sites- call our office immediately and speak with a nurse.  Please call our office with any questions or concerns that you may have.    Please follow-up in 2 weeks as scheduled.

## 2016-12-14 ENCOUNTER — Ambulatory Visit (INDEPENDENT_AMBULATORY_CARE_PROVIDER_SITE_OTHER): Payer: 59 | Admitting: General Surgery

## 2016-12-14 ENCOUNTER — Encounter: Payer: Self-pay | Admitting: General Surgery

## 2016-12-14 VITALS — BP 130/72 | HR 103 | Temp 98.9°F | Ht 66.0 in | Wt 221.0 lb

## 2016-12-14 DIAGNOSIS — Z4889 Encounter for other specified surgical aftercare: Secondary | ICD-10-CM

## 2016-12-14 NOTE — Patient Instructions (Addendum)
We will send a referral to [facility]. They will call to make your appointment. This process generally takes up to 5 business days. If you do not hear from their office by the end of next week, please call and we will check on the status of the referral.   Please do not submerge in a tub, hot tub, or pool until incisions are completely sealed.  Use sun block to incision area over the next year if this area will be exposed to sun. This helps decrease scarring.  You may resume your normal activities on 01/03/17. At that time- Listen to your body when lifting, if you have pain when lifting, stop and then try again in a few days. Soreness after doing exercises or activities of daily living is normal as you get back in to your normal routine.  If you develop redness, drainage, or pain at incision sites- call our office immediately and speak with a nurse.  Please call our office with any questions or concerns that you may have.

## 2016-12-14 NOTE — Progress Notes (Signed)
Outpatient Surgical Follow Up  12/14/2016  Sherri Banks is an 46 y.o. female.   Chief Complaint  Patient presents with  . Routine Post Op    Laparoscopic Cholecystectomy (11/22/16)- Dr. Tonita CongWoodham    HPI: 46 year old female returns to clinic for follow-up now 3 weeks status post laparoscopic cholecystectomy. Patient reports that she continues to have postprandial cramping followed by diarrhea. Doesn't matter what she eats. She has however tolerating liquids without difficulty. She denies any fevers, chills, nausea, vomiting, chest pain, shortness of breath. She has no complaints of active pain from her surgical sites.  Past Medical History:  Diagnosis Date  . Acute calculous cholecystitis 11/21/2016  . Bilateral edema of lower extremity 06/12/2015  . Contusion of knee 08/20/2016  . Drug-induced gastric ulcer    NSAIDs  . Essential hypertension 06/12/2015  . GERD (gastroesophageal reflux disease)   . Hypertension   . Knee pain 11/29/2016  . Narcotic addiction (HCC)    a. uses subutex.  . 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 BREAST REDUCTION SURGERY    . CESAREAN SECTION    . CHOLECYSTECTOMY N/A 11/22/2016   Procedure: LAPAROSCOPIC CHOLECYSTECTOMY;  Surgeon: Ricarda FrameWoodham, Gregary Blackard, MD;  Location: [facility] ORS;  Service: General;  Laterality: N/A;    Family History  Problem Relation Age of Onset  . Healthy Mother   . Healthy Father     Social History:  reports that she has never smoked. She has never used smokeless tobacco. She reports that she does not drink alcohol or use drugs.  Allergies:  Allergies  Allergen Reactions  . Ibuprofen Other (See Comments)    Causes stomach ulcers.  . Ciprofloxacin Diarrhea    Medications reviewed.    ROS A multipoint review of systems was completed, all pertinent positives and negatives are documented within the history of present illness and the remainder are negative.   BP  130/72   Pulse (!) 103   Temp 98.9 F (37.2 C) (Oral)   Ht 5\' 6"  (1.676 m)   Wt 100.2 kg (221 lb)   BMI 35.67 kg/m   Physical Exam Gen.: No acute distress taxine chest: Clear to auscultation Heart: Regular rate and rhythm Abdomen: Soft, nontender, nondistended. Well approximated laparoscopic incision sites. Scab present from the prior drain site without any evidence of infection or drainage. Palpable suture presents on 2 of the laparoscopic incision sites.    No results found for this or any previous visit (from the past 48 hour(s)). No results found.  Assessment/Plan:  1. Aftercare following surgery 46 year old female status post lap scopic cholecystectomy. Having postprandial diarrhea. Discussed that we will refer her to to gastroenterology at this point for further evaluation. Counseled her that should the diarrhea resolved and she can cancel appointment. She voiced understanding. Provided with standard postoperative precautions and signs of infection. She'll follow-up in the surgery clinic on an as-needed basis.     Ricarda Frameharles Miquel Lamson, MD [facility] General Surgeon  12/14/2016,9:59 AM

## 2018-06-01 ENCOUNTER — Encounter: Payer: Self-pay | Admitting: Hematology and Oncology

## 2018-06-02 ENCOUNTER — Inpatient Hospital Stay: Payer: Self-pay | Admitting: Hematology and Oncology

## 2018-06-02 ENCOUNTER — Telehealth: Payer: Self-pay | Admitting: Hematology and Oncology

## 2018-06-02 DIAGNOSIS — D890 Polyclonal hypergammaglobulinemia: Secondary | ICD-10-CM | POA: Insufficient documentation

## 2018-06-02 NOTE — Progress Notes (Deleted)
[facility] day:  06/02/2018  Chief Complaint: Sherri Banks is a 48 y.o. female with an elevated serum protein and abnormal CBC whos is referred in consultation by Nelwyn Salisbury, PA for assessment and management.  HPI: ***  Labs on 05/15/2018 revealed a hematocrit of 36.9, hemoglobin 11.3, MCV 89, platelets 283,000, WBC 5,900.  CMP revealed a protein 8.3, albumen 3.5, creatinine 0.7, and calcium 9.3.  LFTs were normal.  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 Urine protein electrophoresis revealed trace albuminemia and no monoclonal protein.    Past Medical History:  Diagnosis Date  . Acute calculous cholecystitis 11/21/2016  . Bilateral edema of lower extremity 06/12/2015  . Contusion of knee 08/20/2016  . Drug-induced gastric ulcer    NSAIDs  . Essential hypertension 06/12/2015  . GERD (gastroesophageal reflux disease)   . Hypertension   . Knee pain 11/29/2016  . Narcotic addiction ([place])    a. uses subutex.  . 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 BREAST REDUCTION SURGERY    . CESAREAN SECTION    . CHOLECYSTECTOMY N/A 11/22/2016   Procedure: LAPAROSCOPIC CHOLECYSTECTOMY;  Surgeon: Clayburn Pert, MD;  Location: [facility] ORS;  Service: General;  Laterality: N/A;    Family History  Problem Relation Age of Onset  . Healthy Mother   . Healthy Father     Social History:  reports that she has never smoked. She has never used smokeless tobacco. She reports that she does not drink alcohol or use drugs.  The patient is accompanied by *** alone today.  Allergies:  Allergies  Allergen Reactions  . Ibuprofen Other (See Comments)    Causes stomach ulcers.  . Ciprofloxacin Diarrhea    Current Medications: Current Outpatient Medications  Medication Sig Dispense Refill  . amLODipine (NORVASC) 5 MG tablet Take 10 mg by mouth.    . buprenorphine  (SUBUTEX) 2 MG SUBL SL tablet Place 2.5 mg under the tongue daily.     . buprenorphine (SUBUTEX) 8 MG SUBL SL tablet DISSOLVE 1 TABLET UNDER THE TONGUE TWICE A DAY    . carvedilol (COREG) 6.25 MG tablet Take 1 tablet (6.25 mg total) by mouth 2 (two) times daily. 28 tablet 0  . furosemide (LASIX) 20 MG tablet Take 1 tablet (20 mg total) by mouth 2 (two) times daily. 8 tablet 0  . metoprolol succinate (TOPROL-XL) 25 MG 24 hr tablet TK 1 T PO D    . triamterene-hydrochlorothiazide (MAXZIDE-25) 37.5-25 MG tablet Take 1 tablet by mouth daily. 30 tablet 0  . venlafaxine XR (EFFEXOR XR) 150 MG 24 hr capsule Take 2 capsules (300 mg total) by mouth daily with breakfast. 30 capsule 0   No current facility-administered medications for this visit.     Review of Systems:  GENERAL:  Feels good.  Active.  No fevers, sweats or weight loss. PERFORMANCE STATUS (ECOG):  *** HEENT:  No visual changes, runny nose, sore throat, mouth sores or tenderness. Lungs: No shortness of breath or cough.  No hemoptysis. Cardiac:  No chest pain, palpitations, orthopnea, or PND. GI:  No nausea, vomiting, diarrhea, constipation, melena or hematochezia. GU:  No urgency, frequency, dysuria, or hematuria. Musculoskeletal:  No back pain.  No joint pain.  No muscle tenderness. Extremities:  No pain or swelling. Skin:  No rashes or skin changes. Neuro:  No headache, numbness or weakness, balance or coordination issues. Endocrine:  No diabetes, thyroid issues, hot flashes or night sweats. Psych:  No mood changes, depression or anxiety. Pain:  No focal pain. Review of systems:  All other systems reviewed and found to be negative.  Physical Exam: There were no vitals taken for this visit. GENERAL:  Well developed, well nourished, **man sitting comfortably in the exam room in no acute distress. MENTAL STATUS:  Alert and oriented to person, place and time. HEAD:  *** hair.  Normocephalic, atraumatic, face symmetric, no Cushingoid  features. EYES:  *** eyes.  Pupils equal round and reactive to light and accomodation.  No conjunctivitis or scleral icterus. ENT:  Oropharynx clear without lesion.  Tongue normal. Mucous membranes moist.  RESPIRATORY:  Clear to auscultation without rales, wheezes or rhonchi. CARDIOVASCULAR:  Regular rate and rhythm without murmur, rub or gallop. ABDOMEN:  Soft, non-tender, with active bowel sounds, and no hepatosplenomegaly.  No masses. SKIN:  No rashes, ulcers or lesions. EXTREMITIES: No edema, no skin discoloration or tenderness.  No palpable cords. LYMPH NODES: No palpable cervical, supraclavicular, axillary or inguinal adenopathy  NEUROLOGICAL: Unremarkable. PSYCH:  Appropriate.   No visits with results within 3 Day(s) from this visit.  Latest known visit with results is:  Admission on 11/21/2016, Discharged on 11/24/2016  Component Date Value Ref Range Status  . Color, Urine 11/21/2016 YELLOW* YELLOW Final  . APPearance 11/21/2016 HAZY* CLEAR Final  . Specific Gravity, Urine 11/21/2016 1.024  1.005 - 1.030 Final  . pH 11/21/2016 8.0  5.0 - 8.0 Final  . Glucose, UA 11/21/2016 NEGATIVE  NEGATIVE mg/dL Final  . Hgb urine dipstick 11/21/2016 NEGATIVE  NEGATIVE Final  . Bilirubin Urine 11/21/2016 NEGATIVE  NEGATIVE Final  . Ketones, ur 11/21/2016 NEGATIVE  NEGATIVE mg/dL Final  . Protein, ur 11/21/2016 100* NEGATIVE mg/dL Final  . Nitrite 11/21/2016 NEGATIVE  NEGATIVE Final  . Leukocytes, UA 11/21/2016 NEGATIVE  NEGATIVE Final  . RBC / HPF 11/21/2016 6-30  0 - 5 RBC/hpf Final  . WBC, UA 11/21/2016 0-5  0 - 5 WBC/hpf Final  . Bacteria, UA 11/21/2016 RARE* NONE SEEN Final  . Squamous Epithelial / LPF 11/21/2016 6-30* NONE SEEN Final  . WBC 11/21/2016 12.9* 3.6 - 11.0 K/uL Final  . RBC 11/21/2016 4.65  3.80 - 5.20 MIL/uL Final  . Hemoglobin 11/21/2016 14.0  12.0 - 16.0 g/dL Final  . HCT 11/21/2016 40.6  35.0 - 47.0 % Final  . MCV 11/21/2016 87.4  80.0 - 100.0 fL Final  . MCH  11/21/2016 30.1  26.0 - 34.0 pg Final  . MCHC 11/21/2016 34.5  32.0 - 36.0 g/dL Final  . RDW 11/21/2016 13.5  11.5 - 14.5 % Final  . Platelets 11/21/2016 276  150 - 440 K/uL Final  . Neutrophils Relative % 11/21/2016 83  % Final  . Neutro Abs 11/21/2016 10.8* 1.4 - 6.5 K/uL Final  . Lymphocytes Relative 11/21/2016 9  % Final  . Lymphs Abs 11/21/2016 1.1  1.0 - 3.6 K/uL Final  . Monocytes Relative 11/21/2016 6  % Final  . Monocytes Absolute 11/21/2016 0.8  0.2 - 0.9 K/uL Final  . Eosinophils Relative 11/21/2016 1  % Final  . Eosinophils Absolute 11/21/2016 0.1  0 - 0.7 K/uL Final  . Basophils Relative 11/21/2016 1  % Final  . Basophils Absolute 11/21/2016 0.1  0 - 0.1 K/uL Final  . Sodium 11/21/2016 138  135 - 145 mmol/L Final  . Potassium 11/21/2016 3.8  3.5 - 5.1 mmol/L Final  . Chloride 11/21/2016  99* 101 - 111 mmol/L Final  . CO2 11/21/2016 31  22 - 32 mmol/L Final  . Glucose, Bld 11/21/2016 119* 65 - 99 mg/dL Final  . BUN 11/21/2016 11  6 - 20 mg/dL Final  . Creatinine, Ser 11/21/2016 0.89  0.44 - 1.00 mg/dL Final  . Calcium 11/21/2016 9.4  8.9 - 10.3 mg/dL Final  . Total Protein 11/21/2016 8.6* 6.5 - 8.1 g/dL Final  . Albumin 11/21/2016 4.1  3.5 - 5.0 g/dL Final  . AST 11/21/2016 23  15 - 41 U/L Final  . ALT 11/21/2016 14  14 - 54 U/L Final  . Alkaline Phosphatase 11/21/2016 66  38 - 126 U/L Final  . Total Bilirubin 11/21/2016 0.6  0.3 - 1.2 mg/dL Final  . GFR calc non Af Amer 11/21/2016 >60  >60 mL/min Final  . GFR calc Af Amer 11/21/2016 >60  >60 mL/min Final   Comment: (NOTE) The eGFR has been calculated using the CKD EPI equation. This calculation has not been validated in all clinical situations. eGFR's persistently <60 mL/min signify possible Chronic Kidney Disease.   . Anion gap 11/21/2016 8  5 - 15 Final  . Lipase 11/21/2016 21  11 - 51 U/L Final  . Preg Test, Ur 11/21/2016 NEGATIVE  NEGATIVE Final   Comment:        THE SENSITIVITY OF THIS METHODOLOGY IS >24  mIU/mL   . HIV Screen 4th Generation wRfx 11/22/2016 Non Reactive  Non Reactive Final   Comment: (NOTE) Performed At: [facility] [place], [place] [id number] Lindon Romp MD ML:[id number]   . Sodium 11/22/2016 136  135 - 145 mmol/L Final  . Potassium 11/22/2016 3.5  3.5 - 5.1 mmol/L Final  . Chloride 11/22/2016 101  101 - 111 mmol/L Final  . CO2 11/22/2016 28  22 - 32 mmol/L Final  . Glucose, Bld 11/22/2016 140* 65 - 99 mg/dL Final  . BUN 11/22/2016 10  6 - 20 mg/dL Final  . Creatinine, Ser 11/22/2016 0.82  0.44 - 1.00 mg/dL Final  . Calcium 11/22/2016 8.7* 8.9 - 10.3 mg/dL Final  . Total Protein 11/22/2016 7.3  6.5 - 8.1 g/dL Final  . Albumin 11/22/2016 3.2* 3.5 - 5.0 g/dL Final  . AST 11/22/2016 26  15 - 41 U/L Final  . ALT 11/22/2016 18  14 - 54 U/L Final  . Alkaline Phosphatase 11/22/2016 57  38 - 126 U/L Final  . Total Bilirubin 11/22/2016 0.5  0.3 - 1.2 mg/dL Final  . GFR calc non Af Amer 11/22/2016 >60  >60 mL/min Final  . GFR calc Af Amer 11/22/2016 >60  >60 mL/min Final   Comment: (NOTE) The eGFR has been calculated using the CKD EPI equation. This calculation has not been validated in all clinical situations. eGFR's persistently <60 mL/min signify possible Chronic Kidney Disease.   . Anion gap 11/22/2016 7  5 - 15 Final  . WBC 11/22/2016 12.5* 3.6 - 11.0 K/uL Final  . RBC 11/22/2016 4.06  3.80 - 5.20 MIL/uL Final  . Hemoglobin 11/22/2016 12.0  12.0 - 16.0 g/dL Final  . HCT 11/22/2016 36.0  35.0 - 47.0 % Final  . MCV 11/22/2016 88.6  80.0 - 100.0 fL Final  . MCH 11/22/2016 29.6  26.0 - 34.0 pg Final  . MCHC 11/22/2016 33.5  32.0 - 36.0 g/dL Final  . RDW 11/22/2016 14.1  11.5 - 14.5 % Final  . Platelets 11/22/2016 231  150 - 440 K/uL Final  .  MRSA, PCR 11/22/2016 NEGATIVE  NEGATIVE Final  . Staphylococcus aureus 11/22/2016 NEGATIVE  NEGATIVE Final   Comment:        The Xpert SA Assay (FDA approved for NASAL specimens in patients over 32  years of age), is one component of a comprehensive surveillance program.  Test performance has been validated by [facility] for patients greater than or equal to 87 year old. It is not intended to diagnose infection nor to guide or monitor treatment.   Marland Kitchen Specimen Description 11/22/2016 GALL BLADDER GALL BLADDER FLUID   Final  . Special Requests 11/22/2016 NONE   Final  . Gram Stain 11/22/2016    Final                   Value:FEW WBC PRESENT, PREDOMINANTLY PMN NO ORGANISMS SEEN   . Culture 11/22/2016    Final                   Value:NO GROWTH 3 DAYS Performed at [facility], [place] [address] [place], [place] [address]   . Report Status 11/22/2016 11/26/2016 FINAL   Final  . SURGICAL PATHOLOGY 11/22/2016    Final                   Value:Surgical Pathology CASE: [id number] PATIENT: Thora Lance Surgical Pathology Report     SPECIMEN SUBMITTED: A. Gallbladder  CLINICAL HISTORY: None provided  PRE-OPERATIVE DIAGNOSIS: None provided  POST-OPERATIVE DIAGNOSIS: None provided.     DIAGNOSIS: A. GALLBLADDER; CHOLECYSTECTOMY: - CHOLELITHIASIS WITH ACUTE HEMORRHAGIC CHOLECYSTITIS ON A BACKGROUND OF CHRONIC CHOLECYSTITIS.   GROSS DESCRIPTION: A. Labeled: gallbladder Size of specimen: 14.5 x 3.6 x 2 cm Previously opened: yes External surface: mucosal shaggy purple tan Wall thickness: 0.8 cm Mucosa: red sloughing shaggy Stones present: yes, multiple multifaceted yellow, aggregate 1.5 x 2.5 x 1.5 cm Other findings: duct margin inked blue  Block summary: 1- 2-representative sections and en face cystic duct margin  Final Diagnosis performed by Bryan Lemma, MD.  Electronically signed 11/24/2016 3:47:00PM    The electronic signature indicates that the named Attending Pathologist has eval                         uated the specimen  Technical component performed at [facility], [address] [place], [place] [address] Lab: [phone] Dir: Darrick Penna. Evette Doffing,  MD  Professional component performed at [facility], [facility], [place], [place], [place] [address] Lab: [phone] Dir: Dellia Nims. Rubinas, MD    . Sodium 11/23/2016 135  135 - 145 mmol/L Final  . Potassium 11/23/2016 4.2  3.5 - 5.1 mmol/L Final  . Chloride 11/23/2016 102  101 - 111 mmol/L Final  . CO2 11/23/2016 25  22 - 32 mmol/L Final  . Glucose, Bld 11/23/2016 139* 65 - 99 mg/dL Final  . BUN 11/23/2016 10  6 - 20 mg/dL Final  . Creatinine, Ser 11/23/2016 0.99  0.44 - 1.00 mg/dL Final  . Calcium 11/23/2016 8.9  8.9 - 10.3 mg/dL Final  . Total Protein 11/23/2016 7.7  6.5 - 8.1 g/dL Final  . Albumin 11/23/2016 3.4* 3.5 - 5.0 g/dL Final  . AST 11/23/2016 56* 15 - 41 U/L Final  . ALT 11/23/2016 35  14 - 54 U/L Final  . Alkaline Phosphatase 11/23/2016 83  38 - 126 U/L Final  . Total Bilirubin 11/23/2016 0.3  0.3 - 1.2 mg/dL Final  . GFR calc  non Af Amer 11/23/2016 >60  >60 mL/min Final  . GFR calc Af Amer 11/23/2016 >60  >60 mL/min Final   Comment: (NOTE) The eGFR has been calculated using the CKD EPI equation. This calculation has not been validated in all clinical situations. eGFR's persistently <60 mL/min signify possible Chronic Kidney Disease.   . Anion gap 11/23/2016 8  5 - 15 Final  . WBC 11/23/2016 20.0* 3.6 - 11.0 K/uL Final  . RBC 11/23/2016 4.23  3.80 - 5.20 MIL/uL Final  . Hemoglobin 11/23/2016 12.3  12.0 - 16.0 g/dL Final  . HCT 11/23/2016 37.6  35.0 - 47.0 % Final  . MCV 11/23/2016 88.9  80.0 - 100.0 fL Final  . MCH 11/23/2016 29.1  26.0 - 34.0 pg Final  . MCHC 11/23/2016 32.8  32.0 - 36.0 g/dL Final  . RDW 11/23/2016 13.8  11.5 - 14.5 % Final  . Platelets 11/23/2016 248  150 - 440 K/uL Final  . WBC 11/24/2016 14.9* 3.6 - 11.0 K/uL Final  . RBC 11/24/2016 3.61* 3.80 - 5.20 MIL/uL Final  . Hemoglobin 11/24/2016 10.5* 12.0 - 16.0 g/dL Final  . HCT 11/24/2016 32.3* 35.0 - 47.0 % Final  . MCV 11/24/2016 89.3  80.0 - 100.0 fL Final  . MCH  11/24/2016 29.1  26.0 - 34.0 pg Final  . MCHC 11/24/2016 32.6  32.0 - 36.0 g/dL Final  . RDW 11/24/2016 14.0  11.5 - 14.5 % Final  . Platelets 11/24/2016 271  150 - 440 K/uL Final  . Sodium 11/24/2016 135  135 - 145 mmol/L Final  . Potassium 11/24/2016 4.0  3.5 - 5.1 mmol/L Final  . Chloride 11/24/2016 101  101 - 111 mmol/L Final  . CO2 11/24/2016 28  22 - 32 mmol/L Final  . Glucose, Bld 11/24/2016 116* 65 - 99 mg/dL Final  . BUN 11/24/2016 16  6 - 20 mg/dL Final  . Creatinine, Ser 11/24/2016 0.91  0.44 - 1.00 mg/dL Final  . Calcium 11/24/2016 8.5* 8.9 - 10.3 mg/dL Final  . Total Protein 11/24/2016 7.3  6.5 - 8.1 g/dL Final  . Albumin 11/24/2016 2.9* 3.5 - 5.0 g/dL Final  . AST 11/24/2016 38  15 - 41 U/L Final  . ALT 11/24/2016 31  14 - 54 U/L Final  . Alkaline Phosphatase 11/24/2016 52  38 - 126 U/L Final  . Total Bilirubin 11/24/2016 0.3  0.3 - 1.2 mg/dL Final  . GFR calc non Af Amer 11/24/2016 >60  >60 mL/min Final  . GFR calc Af Amer 11/24/2016 >60  >60 mL/min Final   Comment: (NOTE) The eGFR has been calculated using the CKD EPI equation. This calculation has not been validated in all clinical situations. eGFR's persistently <60 mL/min signify possible Chronic Kidney Disease.   . Anion gap 11/24/2016 6  5 - 15 Final    Assessment:  AFTAN VINT is a 48 y.o. female ***  Plan: 1.  Labs today:  CBC with diff, myeloma panel, FLCA. 2.   3.   [place], MD  06/02/2018, 3:13 AM   I saw and evaluated the patient, participating in the key portions of the service and reviewing pertinent diagnostic studies and records.  I reviewed the nurse practitioner's note and agree with the findings and the plan.  The assessment and plan were discussed with the patient.  Additional diagnostic studies of *** are needed to clarify *** and would change the clinical management.  A few ***multiple  questions were asked by the patient and answered.   Nolon Stalls,  MD 06/02/2018,3:13 AM

## 2018-06-02 NOTE — Telephone Encounter (Signed)
Pt left VM message that she could not make New PT appt due to car battery going dead. Attempted to contact pr to r\s appt. No answer and VM is full and cannot accept any new messages. Will return to Referral Work Queue.

## 2018-06-06 ENCOUNTER — Encounter (INDEPENDENT_AMBULATORY_CARE_PROVIDER_SITE_OTHER): Payer: Self-pay | Admitting: Vascular Surgery

## 2018-06-13 ENCOUNTER — Encounter (INDEPENDENT_AMBULATORY_CARE_PROVIDER_SITE_OTHER): Payer: Self-pay | Admitting: Vascular Surgery

## 2018-06-14 ENCOUNTER — Inpatient Hospital Stay: Payer: Medicaid Other | Attending: Hematology and Oncology | Admitting: Hematology and Oncology

## 2018-06-14 DIAGNOSIS — D649 Anemia, unspecified: Secondary | ICD-10-CM | POA: Insufficient documentation

## 2018-06-14 DIAGNOSIS — E538 Deficiency of other specified B group vitamins: Secondary | ICD-10-CM | POA: Insufficient documentation

## 2018-06-14 DIAGNOSIS — D890 Polyclonal hypergammaglobulinemia: Secondary | ICD-10-CM | POA: Insufficient documentation

## 2018-06-14 NOTE — Progress Notes (Deleted)
[facility] day:  06/14/2018  Chief Complaint: Sherri Banks is a 48 y.o. female with an elevated serum protein level and an abnormal CBC who is referred in consultation by Nelwyn Salisbury, PA fro assessment and management.  HPI:   CBC on 05/15/2018 revealed a hematocrit of 36.9, hemoglobin 11.3, platelets 283,000, and WBC 5900.  CMP revealed a creatinine 0.7, protein 8.3, albumen 3.5, and normal LFTs.  SPEP revealed no monoclonal protein.  UPEP revealed no monoclonal protein.  Labs dating back to 09/19/2008 reveal a hematocrit 31.1 - 37.9, hemoglobin 11.1 -12.3, and MCV 88-91 without trend.  Serum protein was 8.6 on 08/23/2017.  Previously, serum protein was 5.6 - 7.4 (5.8 - 7.8).  TSH was 0.84 on 08/23/2017.   Past Medical History:  Diagnosis Date  . Acute calculous cholecystitis 11/21/2016  . Bilateral edema of lower extremity 06/12/2015  . Contusion of knee 08/20/2016  . Drug-induced gastric ulcer    NSAIDs  . Essential hypertension 06/12/2015  . GERD (gastroesophageal reflux disease)   . Hypertension   . Knee pain 11/29/2016  . Narcotic addiction ([place])    a. uses subutex.  . 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 BREAST REDUCTION SURGERY    . CESAREAN SECTION    . CHOLECYSTECTOMY N/A 11/22/2016   Procedure: LAPAROSCOPIC CHOLECYSTECTOMY;  Surgeon: Clayburn Pert, MD;  Location: [facility] ORS;  Service: General;  Laterality: N/A;    Family History  Problem Relation Age of Onset  . Healthy Mother   . Healthy Father     Social History:  reports that she has never smoked. She has never used smokeless tobacco. She reports that she does not drink alcohol or use drugs.  The patient is accompanied by *** alone today.  Allergies:  Allergies  Allergen Reactions  . Ibuprofen Other (See Comments)    Causes stomach ulcers.  . Ciprofloxacin Diarrhea    Current  Medications: Current Outpatient Medications  Medication Sig Dispense Refill  . amLODipine (NORVASC) 5 MG tablet Take 10 mg by mouth.    . buprenorphine (SUBUTEX) 2 MG SUBL SL tablet Place 2.5 mg under the tongue daily.     . buprenorphine (SUBUTEX) 8 MG SUBL SL tablet DISSOLVE 1 TABLET UNDER THE TONGUE TWICE A DAY    . carvedilol (COREG) 6.25 MG tablet Take 1 tablet (6.25 mg total) by mouth 2 (two) times daily. 28 tablet 0  . furosemide (LASIX) 20 MG tablet Take 1 tablet (20 mg total) by mouth 2 (two) times daily. 8 tablet 0  . metoprolol succinate (TOPROL-XL) 25 MG 24 hr tablet TK 1 T PO D    . triamterene-hydrochlorothiazide (MAXZIDE-25) 37.5-25 MG tablet Take 1 tablet by mouth daily. 30 tablet 0  . venlafaxine XR (EFFEXOR XR) 150 MG 24 hr capsule Take 2 capsules (300 mg total) by mouth daily with breakfast. 30 capsule 0   No current facility-administered medications for this visit.     Review of Systems:  GENERAL:  Feels good.  Active.  No fevers, sweats or weight loss. PERFORMANCE STATUS (ECOG):  *** HEENT:  No visual changes, runny nose, sore throat, mouth sores or tenderness. Lungs: No shortness of breath or cough.  No hemoptysis. Cardiac:  No chest pain, palpitations, orthopnea, or PND. GI:  No nausea, vomiting, diarrhea, constipation, melena or hematochezia. GU:  No urgency, frequency, dysuria, or hematuria. Musculoskeletal:  No back pain.  No joint pain.  No muscle tenderness. Extremities:  No pain or swelling. Skin:  No rashes or skin changes. Neuro:  No headache, numbness or weakness, balance or coordination issues. Endocrine:  No diabetes, thyroid issues, hot flashes or night sweats. Psych:  No mood changes, depression or anxiety. Pain:  No focal pain. Review of systems:  All other systems reviewed and found to be negative.  Physical Exam: There were no vitals taken for this visit. GENERAL:  Well developed, well nourished, **man sitting comfortably in the exam room in no  acute distress. MENTAL STATUS:  Alert and oriented to person, place and time. HEAD:  *** hair.  Normocephalic, atraumatic, face symmetric, no Cushingoid features. EYES:  *** eyes.  Pupils equal round and reactive to light and accomodation.  No conjunctivitis or scleral icterus. ENT:  Oropharynx clear without lesion.  Tongue normal. Mucous membranes moist.  RESPIRATORY:  Clear to auscultation without rales, wheezes or rhonchi. CARDIOVASCULAR:  Regular rate and rhythm without murmur, rub or gallop. ABDOMEN:  Soft, non-tender, with active bowel sounds, and no hepatosplenomegaly.  No masses. SKIN:  No rashes, ulcers or lesions. EXTREMITIES: No edema, no skin discoloration or tenderness.  No palpable cords. LYMPH NODES: No palpable cervical, supraclavicular, axillary or inguinal adenopathy  NEUROLOGICAL: Unremarkable. PSYCH:  Appropriate.   No visits with results within 3 Day(s) from this visit.  Latest known visit with results is:  Admission on 11/21/2016, Discharged on 11/24/2016  Component Date Value Ref Range Status  . Color, Urine 11/21/2016 YELLOW* YELLOW Final  . APPearance 11/21/2016 HAZY* CLEAR Final  . Specific Gravity, Urine 11/21/2016 1.024  1.005 - 1.030 Final  . pH 11/21/2016 8.0  5.0 - 8.0 Final  . Glucose, UA 11/21/2016 NEGATIVE  NEGATIVE mg/dL Final  . Hgb urine dipstick 11/21/2016 NEGATIVE  NEGATIVE Final  . Bilirubin Urine 11/21/2016 NEGATIVE  NEGATIVE Final  . Ketones, ur 11/21/2016 NEGATIVE  NEGATIVE mg/dL Final  . Protein, ur 11/21/2016 100* NEGATIVE mg/dL Final  . Nitrite 11/21/2016 NEGATIVE  NEGATIVE Final  . Leukocytes, UA 11/21/2016 NEGATIVE  NEGATIVE Final  . RBC / HPF 11/21/2016 6-30  0 - 5 RBC/hpf Final  . WBC, UA 11/21/2016 0-5  0 - 5 WBC/hpf Final  . Bacteria, UA 11/21/2016 RARE* NONE SEEN Final  . Squamous Epithelial / LPF 11/21/2016 6-30* NONE SEEN Final  . WBC 11/21/2016 12.9* 3.6 - 11.0 K/uL Final  . RBC 11/21/2016 4.65  3.80 - 5.20 MIL/uL Final  .  Hemoglobin 11/21/2016 14.0  12.0 - 16.0 g/dL Final  . HCT 11/21/2016 40.6  35.0 - 47.0 % Final  . MCV 11/21/2016 87.4  80.0 - 100.0 fL Final  . MCH 11/21/2016 30.1  26.0 - 34.0 pg Final  . MCHC 11/21/2016 34.5  32.0 - 36.0 g/dL Final  . RDW 11/21/2016 13.5  11.5 - 14.5 % Final  . Platelets 11/21/2016 276  150 - 440 K/uL Final  . Neutrophils Relative % 11/21/2016 83  % Final  . Neutro Abs 11/21/2016 10.8* 1.4 - 6.5 K/uL Final  . Lymphocytes Relative 11/21/2016 9  % Final  . Lymphs Abs 11/21/2016 1.1  1.0 - 3.6 K/uL Final  . Monocytes Relative 11/21/2016 6  % Final  . Monocytes Absolute 11/21/2016 0.8  0.2 - 0.9 K/uL Final  . Eosinophils Relative 11/21/2016 1  % Final  . Eosinophils Absolute 11/21/2016 0.1  0 - 0.7 K/uL Final  . Basophils Relative 11/21/2016 1  % Final  . Basophils Absolute 11/21/2016  0.1  0 - 0.1 K/uL Final  . Sodium 11/21/2016 138  135 - 145 mmol/L Final  . Potassium 11/21/2016 3.8  3.5 - 5.1 mmol/L Final  . Chloride 11/21/2016 99* 101 - 111 mmol/L Final  . CO2 11/21/2016 31  22 - 32 mmol/L Final  . Glucose, Bld 11/21/2016 119* 65 - 99 mg/dL Final  . BUN 11/21/2016 11  6 - 20 mg/dL Final  . Creatinine, Ser 11/21/2016 0.89  0.44 - 1.00 mg/dL Final  . Calcium 11/21/2016 9.4  8.9 - 10.3 mg/dL Final  . Total Protein 11/21/2016 8.6* 6.5 - 8.1 g/dL Final  . Albumin 11/21/2016 4.1  3.5 - 5.0 g/dL Final  . AST 11/21/2016 23  15 - 41 U/L Final  . ALT 11/21/2016 14  14 - 54 U/L Final  . Alkaline Phosphatase 11/21/2016 66  38 - 126 U/L Final  . Total Bilirubin 11/21/2016 0.6  0.3 - 1.2 mg/dL Final  . GFR calc non Af Amer 11/21/2016 >60  >60 mL/min Final  . GFR calc Af Amer 11/21/2016 >60  >60 mL/min Final   Comment: (NOTE) The eGFR has been calculated using the CKD EPI equation. This calculation has not been validated in all clinical situations. eGFR's persistently <60 mL/min signify possible Chronic Kidney Disease.   . Anion gap 11/21/2016 8  5 - 15 Final  . Lipase  11/21/2016 21  11 - 51 U/L Final  . Preg Test, Ur 11/21/2016 NEGATIVE  NEGATIVE Final   Comment:        THE SENSITIVITY OF THIS METHODOLOGY IS >24 mIU/mL   . HIV Screen 4th Generation wRfx 11/22/2016 Non Reactive  Non Reactive Final   Comment: (NOTE) Performed At: [facility] [place], [place] [id number] Lindon Romp MD DX:[id number]   . Sodium 11/22/2016 136  135 - 145 mmol/L Final  . Potassium 11/22/2016 3.5  3.5 - 5.1 mmol/L Final  . Chloride 11/22/2016 101  101 - 111 mmol/L Final  . CO2 11/22/2016 28  22 - 32 mmol/L Final  . Glucose, Bld 11/22/2016 140* 65 - 99 mg/dL Final  . BUN 11/22/2016 10  6 - 20 mg/dL Final  . Creatinine, Ser 11/22/2016 0.82  0.44 - 1.00 mg/dL Final  . Calcium 11/22/2016 8.7* 8.9 - 10.3 mg/dL Final  . Total Protein 11/22/2016 7.3  6.5 - 8.1 g/dL Final  . Albumin 11/22/2016 3.2* 3.5 - 5.0 g/dL Final  . AST 11/22/2016 26  15 - 41 U/L Final  . ALT 11/22/2016 18  14 - 54 U/L Final  . Alkaline Phosphatase 11/22/2016 57  38 - 126 U/L Final  . Total Bilirubin 11/22/2016 0.5  0.3 - 1.2 mg/dL Final  . GFR calc non Af Amer 11/22/2016 >60  >60 mL/min Final  . GFR calc Af Amer 11/22/2016 >60  >60 mL/min Final   Comment: (NOTE) The eGFR has been calculated using the CKD EPI equation. This calculation has not been validated in all clinical situations. eGFR's persistently <60 mL/min signify possible Chronic Kidney Disease.   . Anion gap 11/22/2016 7  5 - 15 Final  . WBC 11/22/2016 12.5* 3.6 - 11.0 K/uL Final  . RBC 11/22/2016 4.06  3.80 - 5.20 MIL/uL Final  . Hemoglobin 11/22/2016 12.0  12.0 - 16.0 g/dL Final  . HCT 11/22/2016 36.0  35.0 - 47.0 % Final  . MCV 11/22/2016 88.6  80.0 - 100.0 fL Final  . MCH 11/22/2016 29.6  26.0 - 34.0 pg Final  .  MCHC 11/22/2016 33.5  32.0 - 36.0 g/dL Final  . RDW 11/22/2016 14.1  11.5 - 14.5 % Final  . Platelets 11/22/2016 231  150 - 440 K/uL Final  . MRSA, PCR 11/22/2016 NEGATIVE  NEGATIVE Final  .  Staphylococcus aureus 11/22/2016 NEGATIVE  NEGATIVE Final   Comment:        The Xpert SA Assay (FDA approved for NASAL specimens in patients over 66 years of age), is one component of a comprehensive surveillance program.  Test performance has been validated by [facility] for patients greater than or equal to 76 year old. It is not intended to diagnose infection nor to guide or monitor treatment.   Marland Kitchen Specimen Description 11/22/2016 GALL BLADDER GALL BLADDER FLUID   Final  . Special Requests 11/22/2016 NONE   Final  . Gram Stain 11/22/2016    Final                   Value:FEW WBC PRESENT, PREDOMINANTLY PMN NO ORGANISMS SEEN   . Culture 11/22/2016    Final                   Value:NO GROWTH 3 DAYS Performed at [facility], [place] [address] [place], [place] [address]   . Report Status 11/22/2016 11/26/2016 FINAL   Final  . SURGICAL PATHOLOGY 11/22/2016    Final                   Value:Surgical Pathology CASE: [id number] PATIENT: Thora Lance Surgical Pathology Report     SPECIMEN SUBMITTED: A. Gallbladder  CLINICAL HISTORY: None provided  PRE-OPERATIVE DIAGNOSIS: None provided  POST-OPERATIVE DIAGNOSIS: None provided.     DIAGNOSIS: A. GALLBLADDER; CHOLECYSTECTOMY: - CHOLELITHIASIS WITH ACUTE HEMORRHAGIC CHOLECYSTITIS ON A BACKGROUND OF CHRONIC CHOLECYSTITIS.   GROSS DESCRIPTION: A. Labeled: gallbladder Size of specimen: 14.5 x 3.6 x 2 cm Previously opened: yes External surface: mucosal shaggy purple tan Wall thickness: 0.8 cm Mucosa: red sloughing shaggy Stones present: yes, multiple multifaceted yellow, aggregate 1.5 x 2.5 x 1.5 cm Other findings: duct margin inked blue  Block summary: 1- 2-representative sections and en face cystic duct margin  Final Diagnosis performed by Bryan Lemma, MD.  Electronically signed 11/24/2016 3:47:00PM    The electronic signature indicates that the named Attending Pathologist has eval                          uated the specimen  Technical component performed at [facility], [address], [place], [place] [address] Lab: [phone] Dir: Darrick Penna. Evette Doffing, MD  Professional component performed at [facility], [facility], [place], [place], [place] [address] Lab: [phone] Dir: Dellia Nims. Rubinas, MD    . Sodium 11/23/2016 135  135 - 145 mmol/L Final  . Potassium 11/23/2016 4.2  3.5 - 5.1 mmol/L Final  . Chloride 11/23/2016 102  101 - 111 mmol/L Final  . CO2 11/23/2016 25  22 - 32 mmol/L Final  . Glucose, Bld 11/23/2016 139* 65 - 99 mg/dL Final  . BUN 11/23/2016 10  6 - 20 mg/dL Final  . Creatinine, Ser 11/23/2016 0.99  0.44 - 1.00 mg/dL Final  . Calcium 11/23/2016 8.9  8.9 - 10.3 mg/dL Final  . Total Protein 11/23/2016 7.7  6.5 - 8.1 g/dL Final  . Albumin 11/23/2016 3.4* 3.5 - 5.0 g/dL Final  . AST 11/23/2016 56* 15 - 41 U/L Final  . ALT 11/23/2016 35  14 - 54 U/L Final  . Alkaline Phosphatase 11/23/2016 83  38 - 126 U/L Final  . Total Bilirubin 11/23/2016 0.3  0.3 - 1.2 mg/dL Final  . GFR calc non Af Amer 11/23/2016 >60  >60 mL/min Final  . GFR calc Af Amer 11/23/2016 >60  >60 mL/min Final   Comment: (NOTE) The eGFR has been calculated using the CKD EPI equation. This calculation has not been validated in all clinical situations. eGFR's persistently <60 mL/min signify possible Chronic Kidney Disease.   . Anion gap 11/23/2016 8  5 - 15 Final  . WBC 11/23/2016 20.0* 3.6 - 11.0 K/uL Final  . RBC 11/23/2016 4.23  3.80 - 5.20 MIL/uL Final  . Hemoglobin 11/23/2016 12.3  12.0 - 16.0 g/dL Final  . HCT 11/23/2016 37.6  35.0 - 47.0 % Final  . MCV 11/23/2016 88.9  80.0 - 100.0 fL Final  . MCH 11/23/2016 29.1  26.0 - 34.0 pg Final  . MCHC 11/23/2016 32.8  32.0 - 36.0 g/dL Final  . RDW 11/23/2016 13.8  11.5 - 14.5 % Final  . Platelets 11/23/2016 248  150 - 440 K/uL Final  . WBC 11/24/2016 14.9* 3.6 - 11.0 K/uL Final  . RBC 11/24/2016 3.61* 3.80 - 5.20 MIL/uL Final  .  Hemoglobin 11/24/2016 10.5* 12.0 - 16.0 g/dL Final  . HCT 11/24/2016 32.3* 35.0 - 47.0 % Final  . MCV 11/24/2016 89.3  80.0 - 100.0 fL Final  . MCH 11/24/2016 29.1  26.0 - 34.0 pg Final  . MCHC 11/24/2016 32.6  32.0 - 36.0 g/dL Final  . RDW 11/24/2016 14.0  11.5 - 14.5 % Final  . Platelets 11/24/2016 271  150 - 440 K/uL Final  . Sodium 11/24/2016 135  135 - 145 mmol/L Final  . Potassium 11/24/2016 4.0  3.5 - 5.1 mmol/L Final  . Chloride 11/24/2016 101  101 - 111 mmol/L Final  . CO2 11/24/2016 28  22 - 32 mmol/L Final  . Glucose, Bld 11/24/2016 116* 65 - 99 mg/dL Final  . BUN 11/24/2016 16  6 - 20 mg/dL Final  . Creatinine, Ser 11/24/2016 0.91  0.44 - 1.00 mg/dL Final  . Calcium 11/24/2016 8.5* 8.9 - 10.3 mg/dL Final  . Total Protein 11/24/2016 7.3  6.5 - 8.1 g/dL Final  . Albumin 11/24/2016 2.9* 3.5 - 5.0 g/dL Final  . AST 11/24/2016 38  15 - 41 U/L Final  . ALT 11/24/2016 31  14 - 54 U/L Final  . Alkaline Phosphatase 11/24/2016 52  38 - 126 U/L Final  . Total Bilirubin 11/24/2016 0.3  0.3 - 1.2 mg/dL Final  . GFR calc non Af Amer 11/24/2016 >60  >60 mL/min Final  . GFR calc Af Amer 11/24/2016 >60  >60 mL/min Final   Comment: (NOTE) The eGFR has been calculated using the CKD EPI equation. This calculation has not been validated in all clinical situations. eGFR's persistently <60 mL/min signify possible Chronic Kidney Disease.   . Anion gap 11/24/2016 6  5 - 15 Final    Assessment:  RORIE DELMORE is a 48 y.o. female ***  Plan: 1.  Labs today:  CBC with diff, myeloma panel, FLCA, retic, ferritin, iron studies, B12, folate, TSH. 2.  24 hour urine for UPEP. 3.   4.     Lequita Asal, MD  06/14/2018, 3:45 AM   I saw and evaluated the patient, participating in the key portions of the service and reviewing pertinent diagnostic studies and records.  I reviewed the nurse practitioner's note and agree with the findings and the plan.  The assessment and plan were discussed with  the patient.  Additional diagnostic studies of *** are needed to clarify *** and would change the clinical management.  A few ***multiple questions were asked by the patient and answered.   Nolon Stalls, MD 06/14/2018,3:45 AM

## 2018-06-28 ENCOUNTER — Inpatient Hospital Stay: Payer: Medicaid Other | Attending: Hematology and Oncology | Admitting: Hematology and Oncology

## 2018-06-28 ENCOUNTER — Other Ambulatory Visit: Payer: Self-pay

## 2018-06-28 ENCOUNTER — Encounter: Payer: Self-pay | Admitting: Hematology and Oncology

## 2018-06-28 ENCOUNTER — Encounter (INDEPENDENT_AMBULATORY_CARE_PROVIDER_SITE_OTHER): Payer: Self-pay | Admitting: Vascular Surgery

## 2018-06-28 VITALS — BP 134/84 | HR 88 | Temp 98.4°F | Resp 20 | Ht 66.0 in | Wt 228.8 lb

## 2018-06-28 DIAGNOSIS — Z79899 Other long term (current) drug therapy: Secondary | ICD-10-CM

## 2018-06-28 DIAGNOSIS — D649 Anemia, unspecified: Secondary | ICD-10-CM | POA: Insufficient documentation

## 2018-06-28 DIAGNOSIS — R779 Abnormality of plasma protein, unspecified: Secondary | ICD-10-CM

## 2018-06-28 DIAGNOSIS — R040 Epistaxis: Secondary | ICD-10-CM | POA: Diagnosis not present

## 2018-06-28 DIAGNOSIS — N920 Excessive and frequent menstruation with regular cycle: Secondary | ICD-10-CM | POA: Insufficient documentation

## 2018-06-28 LAB — CBC WITH DIFFERENTIAL/PLATELET
ABS IMMATURE GRANULOCYTES: 0.01 10*3/uL (ref 0.00–0.07)
BASOS ABS: 0 10*3/uL (ref 0.0–0.1)
BASOS PCT: 1 %
Eosinophils Absolute: 0.3 10*3/uL (ref 0.0–0.5)
Eosinophils Relative: 6 %
HCT: 36.6 % (ref 36.0–46.0)
HEMOGLOBIN: 11.6 g/dL — AB (ref 12.0–15.0)
IMMATURE GRANULOCYTES: 0 %
LYMPHS PCT: 31 %
Lymphs Abs: 1.6 10*3/uL (ref 0.7–4.0)
MCH: 28.7 pg (ref 26.0–34.0)
MCHC: 31.7 g/dL (ref 30.0–36.0)
MCV: 90.6 fL (ref 80.0–100.0)
Monocytes Absolute: 0.3 10*3/uL (ref 0.1–1.0)
Monocytes Relative: 6 %
NEUTROS ABS: 2.9 10*3/uL (ref 1.7–7.7)
NEUTROS PCT: 56 %
NRBC: 0 % (ref 0.0–0.2)
Platelets: 263 10*3/uL (ref 150–400)
RBC: 4.04 MIL/uL (ref 3.87–5.11)
RDW: 14 % (ref 11.5–15.5)
WBC: 5.2 10*3/uL (ref 4.0–10.5)

## 2018-06-28 LAB — FERRITIN: Ferritin: 48 ng/mL (ref 11–307)

## 2018-06-28 LAB — RETICULOCYTES
IMMATURE RETIC FRACT: 8.4 % (ref 2.3–15.9)
RBC.: 4.06 MIL/uL (ref 3.87–5.11)
RETIC COUNT ABSOLUTE: 51.6 10*3/uL (ref 19.0–186.0)
RETIC CT PCT: 1.3 % (ref 0.4–3.1)

## 2018-06-28 LAB — IRON AND TIBC
Iron: 59 ug/dL (ref 28–170)
Saturation Ratios: 14 % (ref 10.4–31.8)
TIBC: 433 ug/dL (ref 250–450)
UIBC: 374 ug/dL

## 2018-06-28 LAB — VITAMIN B12: Vitamin B-12: 237 pg/mL (ref 180–914)

## 2018-06-28 LAB — TSH: TSH: 0.94 u[IU]/mL (ref 0.350–4.500)

## 2018-06-28 LAB — FOLATE: Folate: 6 ng/mL (ref >5.9)

## 2018-06-28 NOTE — Progress Notes (Signed)
Pt here as a referral from Dr. Fortino Sic office. Pt states she is unsure as to why she is here but understands blood work was abnormal. Denies any concerns.

## 2018-06-28 NOTE — Progress Notes (Signed)
[facility] day:  06/28/2018  Chief Complaint: Sherri Banks is a 48 y.o. female with an elevated serum protein level and an abnormal CBC who is referred in consultation by Nelwyn Salisbury, PA for assessment and management.  HPI:  The patient has a history of menorrhagia. She has required a blood transfusion in the past. Patient has an IUD in place, which has helped significantly.  She notes heavy epistaxis events. Last major episode was back during the summer of 2019.  She denies any use of aspirin or ibuprofen.  She had a C-section and cholecystectomy with bleeding.  She notes a history of breast reduction.  She is unsure if she had any excess bleeding as she had significant swelling post-operatively.  She denies any family history of any bleeding disorders.  She is unsure of the medical history on the paternal side of her family.  CBC on 05/15/2018 revealed a hematocrit of 36.9, hemoglobin 11.3, platelets 283,000, and WBC 5900.  CMP revealed a creatinine 0.7, protein 8.3, albumin 3.5, and normal LFTs.  SPEP revealed no monoclonal protein.  UPEP revealed no monoclonal protein.  Labs dating back to 09/19/2008 reveal a hematocrit 31.1 - 37.9, hemoglobin 11.1 -12.3, and MCV 88-91 without trend.  Serum protein was 8.6 on 08/23/2017.  Previously, serum protein was 5.6 - 7.4 (5.8 - 7.8). TSH was 0.84 on 08/23/2017.  Symptomatically, she is feeling "ok". She is under a great deal of stress related to her unemployment. She has stiffness in her neck, mainly on the RIGHT side. She denies any acute symptoms. Patient had a minor cold x 2 weeks ago that resolved with conservative treatment. Patient denies that she has experienced any B symptoms. She denies any interval infections. She denies nausea, vomiting, or changes to her bowel habits. Patient has not experienced any abdominal pain or urinary symptoms. Patient denies any GI/GU bleeding; no hematochezia, melena, or  gross hematuria.   Patient has dense chronic swelling to her BILATERAL lower extremities. Patient attributes the swelling to buprenorphine citing the fact that swelling improves when she is off of this medication.   Patient advises that she maintains an adequate appetite. She is eating well. She eats meat about 4 x/week and green leafy vegetables about 2 x/week.  Weight today is 228 lb 13.4 oz (103.8 kg). She denies ice pica and restless leg symptoms.   Patient denies family history that is significant for any type of oncologic or hematologic disorder.   Patient denies pain in the clinic today. Of note, patient advises that she used to be addicted to prescription pain medications, which she has since "gotten help".    Past Medical History:  Diagnosis Date  . Acute calculous cholecystitis 11/21/2016  . Bilateral edema of lower extremity 06/12/2015  . Contusion of knee 08/20/2016  . Drug-induced gastric ulcer    NSAIDs  . Essential hypertension 06/12/2015  . GERD (gastroesophageal reflux disease)   . Hypertension   . Knee pain 11/29/2016  . Narcotic addiction ([place])    a. uses subutex.  . 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 BREAST REDUCTION SURGERY    . CESAREAN SECTION    . CHOLECYSTECTOMY N/A 11/22/2016   Procedure: LAPAROSCOPIC CHOLECYSTECTOMY;  Surgeon: Clayburn Pert, MD;  Location: [facility] ORS;  Service: General;  Laterality: N/A;    Family History  Problem Relation Age of Onset  . Healthy Mother   .  Healthy Father   . Cancer Maternal Grandmother     Social History:  reports that she has never smoked. She has never used smokeless tobacco. She reports that she does not drink alcohol or use drugs.  Patient is unemployed. She notes that she was formally addicted to prescription pain medications. Patient denies known exposures to radiation on toxins.  She lives in [place].  The patient is alone  today.  Allergies:  Allergies  Allergen Reactions  . Ibuprofen Other (See Comments)    Causes stomach ulcers.  . Ciprofloxacin Diarrhea    Current Medications: Current Outpatient Medications  Medication Sig Dispense Refill  . amLODipine (NORVASC) 5 MG tablet Take 10 mg by mouth.    Marland Kitchen atenolol (TENORMIN) 25 MG tablet Take 25 mg by mouth 2 (two) times daily.     . buprenorphine (SUBUTEX) 8 MG SUBL SL tablet DISSOLVE 1 TABLET UNDER THE TONGUE TWICE A DAY    . carvedilol (COREG) 6.25 MG tablet Take 1 tablet (6.25 mg total) by mouth 2 (two) times daily. 28 tablet 0  . furosemide (LASIX) 20 MG tablet Take 1 tablet (20 mg total) by mouth 2 (two) times daily. 8 tablet 0  . metoprolol succinate (TOPROL-XL) 25 MG 24 hr tablet TK 1 T PO D    . triamterene-hydrochlorothiazide (MAXZIDE-25) 37.5-25 MG tablet Take 1 tablet by mouth daily. 30 tablet 0  . venlafaxine XR (EFFEXOR-XR) 150 MG 24 hr capsule Take 150 mg by mouth daily with breakfast.     . venlafaxine XR (EFFEXOR-XR) 75 MG 24 hr capsule venlafaxine ER 75 mg capsule,extended release 24 hr     No current facility-administered medications for this visit.     Review of Systems:  GENERAL:  Feels "ok".  No fevers, sweats or weight loss. PERFORMANCE STATUS (ECOG):  1 HEENT:  Cold symptoms 2 week ago.  No visual changes, runny nose, sore throat, mouth sores or tenderness. Lungs: No shortness of breath or cough.  No hemoptysis. Cardiac:  No chest pain, palpitations, orthopnea, or PND. GI:  No nausea, vomiting, diarrhea, constipation, melena or hematochezia. GU:  No urgency, frequency, dysuria, or hematuria. Musculoskeletal:  No back pain.  No joint pain.  No muscle tenderness. Extremities:  No pain or swelling. Skin:  No rashes or skin changes. Neuro:  No headache, numbness or weakness, balance or coordination issues. Endocrine:  No diabetes, thyroid issues, hot flashes or night sweats. Psych:  Stress related to no job.  No mood changes,  depression or anxiety. Pain:  No focal pain. Review of systems:  All other systems reviewed and found to be negative.  Physical Exam: Blood pressure 134/84, pulse 88, temperature 98.4 F (36.9 C), temperature source Oral, resp. rate 20, height '5\' 6"'  (1.676 m), weight 228 lb 13.4 oz (103.8 kg), SpO2 100 %. GENERAL:  Well developed, well nourished, woman sitting comfortably in the exam room in no acute distress. MENTAL STATUS:  Alert and oriented to person, place and time. HEAD:  Curly black hair.  Normocephalic, atraumatic, face symmetric, no Cushingoid features. EYES:  Brown eyes.  Pupils equal round and reactive to light and accomodation.  No conjunctivitis or scleral icterus. ENT:  Thyroid appears full.  Oropharynx clear without lesion.  Tongue normal. Mucous membranes moist.  RESPIRATORY:  Clear to auscultation without rales, wheezes or rhonchi. CARDIOVASCULAR:  Regular rate and rhythm without murmur, rub or gallop. ABDOMEN:  Soft, non-tender, with active bowel sounds, and no appreciable hepatosplenomegaly.  No masses. SKIN:  No rashes, ulcers or lesions. EXTREMITIES: Chronic lower extremity changes.  No skin discoloration or tenderness.  No palpable cords. LYMPH NODES: No palpable cervical, supraclavicular, axillary or inguinal adenopathy  NEUROLOGICAL: Unremarkable. PSYCH:  Appropriate.   No visits with results within 3 Day(s) from this visit.  Latest known visit with results is:  Admission on 11/21/2016, Discharged on 11/24/2016  Component Date Value Ref Range Status  . Color, Urine 11/21/2016 YELLOW* YELLOW Final  . APPearance 11/21/2016 HAZY* CLEAR Final  . Specific Gravity, Urine 11/21/2016 1.024  1.005 - 1.030 Final  . pH 11/21/2016 8.0  5.0 - 8.0 Final  . Glucose, UA 11/21/2016 NEGATIVE  NEGATIVE mg/dL Final  . Hgb urine dipstick 11/21/2016 NEGATIVE  NEGATIVE Final  . Bilirubin Urine 11/21/2016 NEGATIVE  NEGATIVE Final  . Ketones, ur 11/21/2016 NEGATIVE  NEGATIVE mg/dL  Final  . Protein, ur 11/21/2016 100* NEGATIVE mg/dL Final  . Nitrite 11/21/2016 NEGATIVE  NEGATIVE Final  . Leukocytes, UA 11/21/2016 NEGATIVE  NEGATIVE Final  . RBC / HPF 11/21/2016 6-30  0 - 5 RBC/hpf Final  . WBC, UA 11/21/2016 0-5  0 - 5 WBC/hpf Final  . Bacteria, UA 11/21/2016 RARE* NONE SEEN Final  . Squamous Epithelial / LPF 11/21/2016 6-30* NONE SEEN Final  . WBC 11/21/2016 12.9* 3.6 - 11.0 K/uL Final  . RBC 11/21/2016 4.65  3.80 - 5.20 MIL/uL Final  . Hemoglobin 11/21/2016 14.0  12.0 - 16.0 g/dL Final  . HCT 11/21/2016 40.6  35.0 - 47.0 % Final  . MCV 11/21/2016 87.4  80.0 - 100.0 fL Final  . MCH 11/21/2016 30.1  26.0 - 34.0 pg Final  . MCHC 11/21/2016 34.5  32.0 - 36.0 g/dL Final  . RDW 11/21/2016 13.5  11.5 - 14.5 % Final  . Platelets 11/21/2016 276  150 - 440 K/uL Final  . Neutrophils Relative % 11/21/2016 83  % Final  . Neutro Abs 11/21/2016 10.8* 1.4 - 6.5 K/uL Final  . Lymphocytes Relative 11/21/2016 9  % Final  . Lymphs Abs 11/21/2016 1.1  1.0 - 3.6 K/uL Final  . Monocytes Relative 11/21/2016 6  % Final  . Monocytes Absolute 11/21/2016 0.8  0.2 - 0.9 K/uL Final  . Eosinophils Relative 11/21/2016 1  % Final  . Eosinophils Absolute 11/21/2016 0.1  0 - 0.7 K/uL Final  . Basophils Relative 11/21/2016 1  % Final  . Basophils Absolute 11/21/2016 0.1  0 - 0.1 K/uL Final  . Sodium 11/21/2016 138  135 - 145 mmol/L Final  . Potassium 11/21/2016 3.8  3.5 - 5.1 mmol/L Final  . Chloride 11/21/2016 99* 101 - 111 mmol/L Final  . CO2 11/21/2016 31  22 - 32 mmol/L Final  . Glucose, Bld 11/21/2016 119* 65 - 99 mg/dL Final  . BUN 11/21/2016 11  6 - 20 mg/dL Final  . Creatinine, Ser 11/21/2016 0.89  0.44 - 1.00 mg/dL Final  . Calcium 11/21/2016 9.4  8.9 - 10.3 mg/dL Final  . Total Protein 11/21/2016 8.6* 6.5 - 8.1 g/dL Final  . Albumin 11/21/2016 4.1  3.5 - 5.0 g/dL Final  . AST 11/21/2016 23  15 - 41 U/L Final  . ALT 11/21/2016 14  14 - 54 U/L Final  . Alkaline Phosphatase  11/21/2016 66  38 - 126 U/L Final  . Total Bilirubin 11/21/2016 0.6  0.3 - 1.2 mg/dL Final  . GFR calc non Af Amer 11/21/2016 >60  >60 mL/min Final  . GFR calc Af Amer 11/21/2016 >60  >60  mL/min Final   Comment: (NOTE) The eGFR has been calculated using the CKD EPI equation. This calculation has not been validated in all clinical situations. eGFR's persistently <60 mL/min signify possible Chronic Kidney Disease.   . Anion gap 11/21/2016 8  5 - 15 Final  . Lipase 11/21/2016 21  11 - 51 U/L Final  . Preg Test, Ur 11/21/2016 NEGATIVE  NEGATIVE Final   Comment:        THE SENSITIVITY OF THIS METHODOLOGY IS >24 mIU/mL   . HIV Screen 4th Generation wRfx 11/22/2016 Non Reactive  Non Reactive Final   Comment: (NOTE) Performed At: [facility] [place], [place] [id number] Lindon Romp MD [id number]   . Sodium 11/22/2016 136  135 - 145 mmol/L Final  . Potassium 11/22/2016 3.5  3.5 - 5.1 mmol/L Final  . Chloride 11/22/2016 101  101 - 111 mmol/L Final  . CO2 11/22/2016 28  22 - 32 mmol/L Final  . Glucose, Bld 11/22/2016 140* 65 - 99 mg/dL Final  . BUN 11/22/2016 10  6 - 20 mg/dL Final  . Creatinine, Ser 11/22/2016 0.82  0.44 - 1.00 mg/dL Final  . Calcium 11/22/2016 8.7* 8.9 - 10.3 mg/dL Final  . Total Protein 11/22/2016 7.3  6.5 - 8.1 g/dL Final  . Albumin 11/22/2016 3.2* 3.5 - 5.0 g/dL Final  . AST 11/22/2016 26  15 - 41 U/L Final  . ALT 11/22/2016 18  14 - 54 U/L Final  . Alkaline Phosphatase 11/22/2016 57  38 - 126 U/L Final  . Total Bilirubin 11/22/2016 0.5  0.3 - 1.2 mg/dL Final  . GFR calc non Af Amer 11/22/2016 >60  >60 mL/min Final  . GFR calc Af Amer 11/22/2016 >60  >60 mL/min Final   Comment: (NOTE) The eGFR has been calculated using the CKD EPI equation. This calculation has not been validated in all clinical situations. eGFR's persistently <60 mL/min signify possible Chronic Kidney Disease.   . Anion gap 11/22/2016 7  5 - 15 Final  . WBC  11/22/2016 12.5* 3.6 - 11.0 K/uL Final  . RBC 11/22/2016 4.06  3.80 - 5.20 MIL/uL Final  . Hemoglobin 11/22/2016 12.0  12.0 - 16.0 g/dL Final  . HCT 11/22/2016 36.0  35.0 - 47.0 % Final  . MCV 11/22/2016 88.6  80.0 - 100.0 fL Final  . MCH 11/22/2016 29.6  26.0 - 34.0 pg Final  . MCHC 11/22/2016 33.5  32.0 - 36.0 g/dL Final  . RDW 11/22/2016 14.1  11.5 - 14.5 % Final  . Platelets 11/22/2016 231  150 - 440 K/uL Final  . MRSA, PCR 11/22/2016 NEGATIVE  NEGATIVE Final  . Staphylococcus aureus 11/22/2016 NEGATIVE  NEGATIVE Final   Comment:        The Xpert SA Assay (FDA approved for NASAL specimens in patients over 80 years of age), is one component of a comprehensive surveillance program.  Test performance has been validated by [facility] for patients greater than or equal to 14 year old. It is not intended to diagnose infection nor to guide or monitor treatment.   Marland Kitchen Specimen Description 11/22/2016 GALL BLADDER GALL BLADDER FLUID   Final  . Special Requests 11/22/2016 NONE   Final  . Gram Stain 11/22/2016    Final                   Value:FEW WBC PRESENT, PREDOMINANTLY PMN NO ORGANISMS SEEN   . Culture 11/22/2016    Final  Value:NO GROWTH 3 DAYS Performed at [facility], [place] [address] [place], [place] [address]   . Report Status 11/22/2016 11/26/2016 FINAL   Final  . SURGICAL PATHOLOGY 11/22/2016    Final                   Value:Surgical Pathology CASE: [id number] PATIENT: Sherri Banks Surgical Pathology Report     SPECIMEN SUBMITTED: A. Gallbladder  CLINICAL HISTORY: None provided  PRE-OPERATIVE DIAGNOSIS: None provided  POST-OPERATIVE DIAGNOSIS: None provided.     DIAGNOSIS: A. GALLBLADDER; CHOLECYSTECTOMY: - CHOLELITHIASIS WITH ACUTE HEMORRHAGIC CHOLECYSTITIS ON A BACKGROUND OF CHRONIC CHOLECYSTITIS.   GROSS DESCRIPTION: A. Labeled: gallbladder Size of specimen: 14.5 x 3.6 x 2 cm Previously opened: yes External surface:  mucosal shaggy purple tan Wall thickness: 0.8 cm Mucosa: red sloughing shaggy Stones present: yes, multiple multifaceted yellow, aggregate 1.5 x 2.5 x 1.5 cm Other findings: duct margin inked blue  Block summary: 1- 2-representative sections and en face cystic duct margin  Final Diagnosis performed by Bryan Lemma, MD.  Electronically signed 11/24/2016 3:47:00PM    The electronic signature indicates that the named Attending Pathologist has eval                         uated the specimen  Technical component performed at [facility], [address] [place], [place] [address] Lab: [phone] Dir: Darrick Penna. Evette Doffing, MD  Professional component performed at [facility], [facility], [place], [place], [place] [address] Lab: [phone] Dir: Dellia Nims. Rubinas, MD    . Sodium 11/23/2016 135  135 - 145 mmol/L Final  . Potassium 11/23/2016 4.2  3.5 - 5.1 mmol/L Final  . Chloride 11/23/2016 102  101 - 111 mmol/L Final  . CO2 11/23/2016 25  22 - 32 mmol/L Final  . Glucose, Bld 11/23/2016 139* 65 - 99 mg/dL Final  . BUN 11/23/2016 10  6 - 20 mg/dL Final  . Creatinine, Ser 11/23/2016 0.99  0.44 - 1.00 mg/dL Final  . Calcium 11/23/2016 8.9  8.9 - 10.3 mg/dL Final  . Total Protein 11/23/2016 7.7  6.5 - 8.1 g/dL Final  . Albumin 11/23/2016 3.4* 3.5 - 5.0 g/dL Final  . AST 11/23/2016 56* 15 - 41 U/L Final  . ALT 11/23/2016 35  14 - 54 U/L Final  . Alkaline Phosphatase 11/23/2016 83  38 - 126 U/L Final  . Total Bilirubin 11/23/2016 0.3  0.3 - 1.2 mg/dL Final  . GFR calc non Af Amer 11/23/2016 >60  >60 mL/min Final  . GFR calc Af Amer 11/23/2016 >60  >60 mL/min Final   Comment: (NOTE) The eGFR has been calculated using the CKD EPI equation. This calculation has not been validated in all clinical situations. eGFR's persistently <60 mL/min signify possible Chronic Kidney Disease.   . Anion gap 11/23/2016 8  5 - 15 Final  . WBC 11/23/2016 20.0* 3.6 - 11.0 K/uL Final  .  RBC 11/23/2016 4.23  3.80 - 5.20 MIL/uL Final  . Hemoglobin 11/23/2016 12.3  12.0 - 16.0 g/dL Final  . HCT 11/23/2016 37.6  35.0 - 47.0 % Final  . MCV 11/23/2016 88.9  80.0 - 100.0 fL Final  . MCH 11/23/2016 29.1  26.0 - 34.0 pg Final  . MCHC 11/23/2016 32.8  32.0 - 36.0 g/dL Final  . RDW 11/23/2016 13.8  11.5 - 14.5 % Final  . Platelets 11/23/2016 248  150 - 440 K/uL Final  . WBC  11/24/2016 14.9* 3.6 - 11.0 K/uL Final  . RBC 11/24/2016 3.61* 3.80 - 5.20 MIL/uL Final  . Hemoglobin 11/24/2016 10.5* 12.0 - 16.0 g/dL Final  . HCT 11/24/2016 32.3* 35.0 - 47.0 % Final  . MCV 11/24/2016 89.3  80.0 - 100.0 fL Final  . MCH 11/24/2016 29.1  26.0 - 34.0 pg Final  . MCHC 11/24/2016 32.6  32.0 - 36.0 g/dL Final  . RDW 11/24/2016 14.0  11.5 - 14.5 % Final  . Platelets 11/24/2016 271  150 - 440 K/uL Final  . Sodium 11/24/2016 135  135 - 145 mmol/L Final  . Potassium 11/24/2016 4.0  3.5 - 5.1 mmol/L Final  . Chloride 11/24/2016 101  101 - 111 mmol/L Final  . CO2 11/24/2016 28  22 - 32 mmol/L Final  . Glucose, Bld 11/24/2016 116* 65 - 99 mg/dL Final  . BUN 11/24/2016 16  6 - 20 mg/dL Final  . Creatinine, Ser 11/24/2016 0.91  0.44 - 1.00 mg/dL Final  . Calcium 11/24/2016 8.5* 8.9 - 10.3 mg/dL Final  . Total Protein 11/24/2016 7.3  6.5 - 8.1 g/dL Final  . Albumin 11/24/2016 2.9* 3.5 - 5.0 g/dL Final  . AST 11/24/2016 38  15 - 41 U/L Final  . ALT 11/24/2016 31  14 - 54 U/L Final  . Alkaline Phosphatase 11/24/2016 52  38 - 126 U/L Final  . Total Bilirubin 11/24/2016 0.3  0.3 - 1.2 mg/dL Final  . GFR calc non Af Amer 11/24/2016 >60  >60 mL/min Final  . GFR calc Af Amer 11/24/2016 >60  >60 mL/min Final   Comment: (NOTE) The eGFR has been calculated using the CKD EPI equation. This calculation has not been validated in all clinical situations. eGFR's persistently <60 mL/min signify possible Chronic Kidney Disease.   . Anion gap 11/24/2016 6  5 - 15 Final    Assessment:  RAKEYA GLAB is a 48 y.o.  female with mild anemia and an elevated protein.  Labs dating back to 09/19/2008 reveal a hematocrit 31.1 - 37.9, hemoglobin 11.1 -12.3, and MCV 88-91 without trend.  She notes a history of heavy menses.  She notes intermittent epistaxis.  She has done well with surgeries (C-section and cholecystectomy).  She may have had excess bleeding with a breast reduction. She denies any family history of any bleeding disorders.  She does not take ibuprofen or aspirin.  CBC on 05/15/2018 revealed a hematocrit of 36.9, hemoglobin 11.3, platelets 283,000, and WBC 5900.  Creatinine was 0.7, protein 8.3, albumin 3.5, and normal LFTs.  SPEP revealed no monoclonal protein.  UPEP revealed no monoclonal protein.  TSH was 0.84 on 08/23/2017.  Symptomatically, she feels well.  Exam reveals thyroid fullness.  Plan: 1. Labs today:  CBC with diff, myeloma panel, FLCA, retic, ferritin, iron studies, B12, folate, TSH. 2.   Elevated serum protein  Outside labs reveal no monoclonal protein.  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  Check myeloma panel, FLCA. 3.   Normocytic anemia  Diet appears good.  She has had a history of menorrhagia improved with IUD.  Discuss anemia work-up. 4.   Possible bleeding diathesis  Consider follow-up testing with PT, PTT, platelet function assay, and von Willebrand panel. 5.  Consider thyroid ultrasound. 6.  RTC in 1 week for MD assessment, review of work-up and discussion regarding direction of therapy.   Honor Loh, NP  06/28/2018, 3:06 PM   I saw and evaluated the patient, participating in the key portions of the service and reviewing  pertinent diagnostic studies and records.  I reviewed the nurse practitioner's note and agree with the findings and the plan.  The assessment and plan were discussed with the patient.  Multiple questions were asked by the patient and answered.   Nolon Stalls, MD 06/28/2018,3:06 PM

## 2018-06-29 LAB — KAPPA/LAMBDA LIGHT CHAINS
KAPPA FREE LGHT CHN: 23.7 mg/L — AB (ref 3.3–19.4)
Kappa, lambda light chain ratio: 1.11 (ref 0.26–1.65)
Lambda free light chains: 21.3 mg/L (ref 5.7–26.3)

## 2018-06-30 LAB — MULTIPLE MYELOMA PANEL, SERUM
Albumin SerPl Elph-Mcnc: 3.8 g/dL (ref 2.9–4.4)
Albumin/Glob SerPl: 0.9 (ref 0.7–1.7)
Alpha 1: 0.2 g/dL (ref 0.0–0.4)
Alpha2 Glob SerPl Elph-Mcnc: 0.8 g/dL (ref 0.4–1.0)
B-Globulin SerPl Elph-Mcnc: 1.4 g/dL — ABNORMAL HIGH (ref 0.7–1.3)
Gamma Glob SerPl Elph-Mcnc: 2.1 g/dL — ABNORMAL HIGH (ref 0.4–1.8)
Globulin, Total: 4.4 g/dL — ABNORMAL HIGH (ref 2.2–3.9)
IGG (IMMUNOGLOBIN G), SERUM: 2144 mg/dL — AB (ref 700–1600)
IGM (IMMUNOGLOBULIN M), SRM: 119 mg/dL (ref 26–217)
IgA: 358 mg/dL — ABNORMAL HIGH (ref 87–352)
Total Protein ELP: 8.2 g/dL (ref 6.0–8.5)

## 2018-07-05 ENCOUNTER — Telehealth: Payer: Self-pay

## 2018-07-05 ENCOUNTER — Encounter: Payer: Self-pay | Admitting: Hematology and Oncology

## 2018-07-05 ENCOUNTER — Inpatient Hospital Stay (HOSPITAL_BASED_OUTPATIENT_CLINIC_OR_DEPARTMENT_OTHER): Payer: Medicaid Other | Admitting: Hematology and Oncology

## 2018-07-05 VITALS — BP 135/85 | HR 92 | Temp 98.1°F | Resp 18 | Ht 66.0 in | Wt 235.6 lb

## 2018-07-05 DIAGNOSIS — D890 Polyclonal hypergammaglobulinemia: Secondary | ICD-10-CM | POA: Diagnosis not present

## 2018-07-05 DIAGNOSIS — D649 Anemia, unspecified: Secondary | ICD-10-CM | POA: Insufficient documentation

## 2018-07-05 DIAGNOSIS — E538 Deficiency of other specified B group vitamins: Secondary | ICD-10-CM | POA: Insufficient documentation

## 2018-07-05 NOTE — Progress Notes (Signed)
[facility]-  Cancer Center  Clinic day:  07/05/2018  Chief Complaint: Sherri Banks is a 48 y.o. female with an elevated serum protein level and an abnormal CBC who is seen for review of work-up and discussion regarding direction of therapy.  HPI:  The patient was last seen in the hematology clinic on 06/28/2018 for initial consultation.  She had mild anemia and an elevated protein.  She described a history of menorrhagia and epistaxis.  Exam reveals thyroid fullness.  Work-up included a hematocrit of 36.6, hemoglobin 11.6, platelets 263,000, white count 5200 with an ANC of 2900.  Ferritin was 48.  Iron studies included a saturation of 14% and a TIBC of 433.  Reticulocyte count was 1.3%.  B12 was 237.  Folate was 6.0.  TSH was 0.94.  SPEP revealed a polyclonal gammopathy.  IgG, kappa and lambda light chains were increased.  IgG was 2144.  Kappa free light chains were 23.7, lambda free light chains 21.3, and ratio 1.11 (0.26-1.65).  During the interim, patient is doing well today. She does not make note of any acute or concerning symptoms. She feels generally well. Patient denies that she has experienced any B symptoms. She denies any interval infections. Patient tearful in clinic due to stress. Patient still in the process of looking for work. She is also concerned about her workup.   Patient advises that she maintains an adequate appetite. She is eating well. Weight today is 235 lb 9 oz (106.8 kg), which compared to her last visit to the clinic, represents a 7 pound increase.    Patient denies pain in the clinic today.   Past Medical History:  Diagnosis Date  . Acute calculous cholecystitis 11/21/2016  . Bilateral edema of lower extremity 06/12/2015  . Contusion of knee 08/20/2016  . Drug-induced gastric ulcer    NSAIDs  . Essential hypertension 06/12/2015  . GERD (gastroesophageal reflux disease)   . Hypertension   . Knee pain 11/29/2016  . Narcotic addiction (HCC)    a.  uses subutex.  . 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 BREAST REDUCTION SURGERY    . CESAREAN SECTION    . CHOLECYSTECTOMY N/A 11/22/2016   Procedure: LAPAROSCOPIC CHOLECYSTECTOMY;  Surgeon: Ricarda Frame, MD;  Location: [facility] ORS;  Service: General;  Laterality: N/A;    Family History  Problem Relation Age of Onset  . Healthy Mother   . Healthy Father   . Cancer Maternal Grandmother     Social History:  reports that she has never smoked. She has never used smokeless tobacco. She reports that she does not drink alcohol or use drugs.  Patient is unemployed. She notes that she was formally addicted to prescription pain medications. Patient denies known exposures to radiation on toxins.  She lives in [place].  The patient is alone today.  Allergies:  Allergies  Allergen Reactions  . Ibuprofen Other (See Comments)    Causes stomach ulcers.  . Ciprofloxacin Diarrhea    Current Medications: Current Outpatient Medications  Medication Sig Dispense Refill  . amLODipine (NORVASC) 5 MG tablet Take 10 mg by mouth.    Marland Kitchen atenolol (TENORMIN) 25 MG tablet Take 25 mg by mouth 2 (two) times daily.     . buprenorphine (SUBUTEX) 8 MG SUBL SL tablet DISSOLVE 1 TABLET UNDER THE TONGUE TWICE A DAY    . carvedilol (COREG) 6.25 MG tablet Take 1 tablet (6.25 mg  total) by mouth 2 (two) times daily. 28 tablet 0  . furosemide (LASIX) 20 MG tablet Take 1 tablet (20 mg total) by mouth 2 (two) times daily. 8 tablet 0  . metoprolol succinate (TOPROL-XL) 25 MG 24 hr tablet TK 1 T PO D    . triamterene-hydrochlorothiazide (MAXZIDE-25) 37.5-25 MG tablet Take 1 tablet by mouth daily. 30 tablet 0  . venlafaxine XR (EFFEXOR-XR) 150 MG 24 hr capsule Take 150 mg by mouth daily with breakfast.     . venlafaxine XR (EFFEXOR-XR) 75 MG 24 hr capsule venlafaxine ER 75 mg capsule,extended release 24 hr     No current facility-administered  medications for this visit.     Review of Systems:  GENERAL:  Feels well.  No acute concerns.  No fevers, sweats.  Weight up 7 pounds. PERFORMANCE STATUS (ECOG): 0 HEENT:  No visual changes, runny nose, sore throat, mouth sores or tenderness. Lungs: No shortness of breath or cough.  No hemoptysis. Cardiac:  No chest pain, palpitations, orthopnea, or PND. GI:  No nausea, vomiting, diarrhea, constipation, melena or hematochezia. GU:  No urgency, frequency, dysuria, or hematuria. Musculoskeletal:  No back pain.  No joint pain.  No muscle tenderness. Extremities:  No pain or swelling. Skin:  No rashes or skin changes. Neuro:  No headache, numbness or weakness, balance or coordination issues. Endocrine:  No diabetes, thyroid issues, hot flashes or night sweats. Psych:  Stress related to job.  No mood changes, depression or anxiety. Pain:  No focal pain. Review of systems:  All other systems reviewed and found to be negative.   Physical Exam: Blood pressure 135/85, pulse 92, temperature 98.1 F (36.7 C), temperature source Tympanic, resp. rate 18, height  (1.676 m), weight 235 lb 9 oz (106.8 kg), SpO2 100 %. GENERAL:  Well developed, well nourished, woman sitting comfortably in the exam room in no acute distress. MENTAL STATUS:  Alert and oriented to person, place and time. HEAD:  Curly black hair.  Normocephalic, atraumatic, face symmetric, no Cushingoid features. EYES:  Brown eyes. No conjunctivitis or scleral icterus. NEUROLOGICAL: Unremarkable. PSYCH:  Appropriate.  Tearful.   No visits with results within 3 Day(s) from this visit.  Latest known visit with results is:  Appointment on 06/28/2018  Component Date Value Ref Range Status  . TSH 06/28/2018 0.940  0.350 - 4.500 uIU/mL Final   Comment: Performed by a 3rd Generation assay with a functional sensitivity of <=0.01 uIU/mL. Performed at [facility], [address], [place], [place] [address]   . Folate  06/28/2018 6.0  >5.9 ng/mL Final   Performed at [facility], [address] [place]., [place], [place] [address]  . Retic Ct Pct 06/28/2018 1.3  0.4 - 3.1 % Final  . RBC. 06/28/2018 4.06  3.87 - 5.11 MIL/uL Final  . Retic Count, Absolute 06/28/2018 51.6  19.0 - 186.0 K/uL Final  . Immature Retic Fract 06/28/2018 8.4  2.3 - 15.9 % Final   Performed at [facility], [address] [place], [place] [address]  . Vitamin B-12 06/28/2018 237  180 - 914 pg/mL Final   Comment: (NOTE) This assay is not validated for testing neonatal or myeloproliferative syndrome specimens for Vitamin B12 levels. Performed at [facility], [address], [place], [place] [address]   . Iron 06/28/2018 59  28 - 170 ug/dL Final  . TIBC 29/56/2130 433  250 - 450 ug/dL Final  . Saturation Ratios 06/28/2018 14  10.4 -  31.8 % Final  . UIBC 06/28/2018 374  ug/dL Final   Performed at [facility], [address] [place]., [place], [place] [address]  . Ferritin 06/28/2018 48  11 - 307 ng/mL Final   Performed at [facility], [address] [place]., [place], [place] [address]  . Kappa free light chain 06/28/2018 23.7* 3.3 - 19.4 mg/L Final  . Lamda free light chains 06/28/2018 21.3  5.7 - 26.3 mg/L Final  . Kappa, lamda light chain ratio 06/28/2018 1.11  0.26 - 1.65 Final   Comment: (NOTE) Performed At: [facility] [address] [place], [place] [id number] Jolene Schimke MD [id number]   . IgG (Immunoglobin G), Serum 06/28/2018 2,144* 700 - 1,600 mg/dL Final  . IgA 67/20/9470 358* 87 - 352 mg/dL Final  . IgM (Immunoglobulin M), Srm 06/28/2018 119  26 - 217 mg/dL Final  . Total Protein ELP 06/28/2018 8.2  6.0 - 8.5 g/dL Corrected  . Albumin SerPl Elph-Mcnc 06/28/2018 3.8  2.9 - 4.4 g/dL Corrected  . Alpha 1 06/28/2018 0.2  0.0 - 0.4 g/dL Corrected  . Alpha2 Glob SerPl Elph-Mcnc 06/28/2018 0.8  0.4 - 1.0 g/dL Corrected  . B-Globulin SerPl Elph-Mcnc 06/28/2018 1.4* 0.7 - 1.3 g/dL  Corrected  . Gamma Glob SerPl Elph-Mcnc 06/28/2018 2.1* 0.4 - 1.8 g/dL Corrected  . M Protein SerPl Elph-Mcnc 06/28/2018 Not Observed  Not Observed g/dL Corrected  . Globulin, Total 06/28/2018 4.4* 2.2 - 3.9 g/dL Corrected  . Albumin/Glob SerPl 06/28/2018 0.9  0.7 - 1.7 Corrected  . IFE 1 06/28/2018 Comment   Corrected   Comment: (NOTE) An apparent polyclonal gammopathy: IgG. Kappa and lambda typing appear increased.   . Please Note 06/28/2018 Comment   Corrected   Comment: (NOTE) Protein electrophoresis scan will follow via computer, mail, or courier delivery. Performed At: [facility] [address] [place], [place] [id number] Jolene Schimke MD [id number]   . WBC 06/28/2018 5.2  4.0 - 10.5 K/uL Final  . RBC 06/28/2018 4.04  3.87 - 5.11 MIL/uL Final  . Hemoglobin 06/28/2018 11.6* 12.0 - 15.0 g/dL Final  . HCT 56/81/2751 36.6  36.0 - 46.0 % Final  . MCV 06/28/2018 90.6  80.0 - 100.0 fL Final  . MCH 06/28/2018 28.7  26.0 - 34.0 pg Final  . MCHC 06/28/2018 31.7  30.0 - 36.0 g/dL Final  . RDW 70/05/7492 14.0  11.5 - 15.5 % Final  . Platelets 06/28/2018 263  150 - 400 K/uL Final  . nRBC 06/28/2018 0.0  0.0 - 0.2 % Final  . Neutrophils Relative % 06/28/2018 56  % Final  . Neutro Abs 06/28/2018 2.9  1.7 - 7.7 K/uL Final  . Lymphocytes Relative 06/28/2018 31  % Final  . Lymphs Abs 06/28/2018 1.6  0.7 - 4.0 K/uL Final  . Monocytes Relative 06/28/2018 6  % Final  . Monocytes Absolute 06/28/2018 0.3  0.1 - 1.0 K/uL Final  . Eosinophils Relative 06/28/2018 6  % Final  . Eosinophils Absolute 06/28/2018 0.3  0.0 - 0.5 K/uL Final  . Basophils Relative 06/28/2018 1  % Final  . Basophils Absolute 06/28/2018 0.0  0.0 - 0.1 K/uL Final  . Immature Granulocytes 06/28/2018 0  % Final  . Abs Immature Granulocytes 06/28/2018 0.01  0.00 - 0.07 K/uL Final   Performed at [facility], [address] [place], [place] [address]    Assessment:  NEVEEN ULRICK is a 48 y.o.  female with mild anemia and an elevated protein.  Labs dating back to 09/19/2008 reveal a hematocrit 31.1 - 37.9, hemoglobin 11.1 -12.3, and MCV 88-91 without trend.  She notes a history of heavy menses.  She notes intermittent epistaxis.  She has done well with surgeries (C-section and cholecystectomy).  She may have had excess bleeding with a breast reduction. She denies any family history of any bleeding disorders.  She does not take ibuprofen or aspirin.  CBC on 05/15/2018 revealed a hematocrit of 36.9, hemoglobin 11.3, platelets 283,000, and WBC 5900.  Creatinine was 0.7, protein 8.3, albumin 3.5, and normal LFTs.  SPEP revealed no monoclonal protein.  UPEP revealed no monoclonal protein.  TSH was 0.84 on 08/23/2017.  Work-up on 06/28/2018 revealed a hematocrit of 36.6, hemoglobin 11.6, platelets 263,000, white count 5200 with an ANC of 2900.  Ferritin was 48.  Iron studies included a saturation of 14% and a TIBC of 433.  Reticulocyte count was 1.3%.  B12 was 237.  Folate was 6.0 (> 5.9).  TSH was 0.94.  SPEP revealed a polyclonal gammopathy.  IgG, kappa light chains, and lambda light chains were increased.  IgG was 2144.  Kappa free light chains were 23.7, lambda free light chains 21.3, and ratio 1.11 (0.26-1.65).  Symptomatically, she denies any concerns.  Exam is unremarkable.  B12 is low.  Plan: 1. Review labs from 06/28/2018. 2.   Elevated serum protein  SPEP reveals a polyclonal gammopathy.  No evidence of a monoclonal gammopathy. 3.   Normocytic anemia  Hematocrit 36.6.  Hemoglobin 11.6.  MCV 90.6.  Ferritin is 48.  Folate is normal, but borderline.  B12 is 237 (low).  B12 goal is 400.  Begin oral B12 1000 mcg a day.  Discuss follow-up level with PCP.   Suggest checking B12 and folate in 1 month. 4.   Possible bleeding diathesis  Discuss consideration of work-up:  PT, PTT, platelet function assay, and von Willebrand panel. 5.   Thyroid fullness  Consider thyroid ultrasound. 6.    RTC prn.   Quentin MullingBryan Gray, NP  07/05/2018, 9:06 AM   I saw and evaluated the patient, participating in the key portions of the service and reviewing pertinent diagnostic studies and records.  I reviewed the nurse practitioner's note and agree with the findings and the plan.  The assessment and plan were discussed with the patient.  Multiple questions were asked by the patient and answered.   Nelva NayMelissa Corcoran, MD 07/05/2018,9:06 AM

## 2018-07-05 NOTE — Progress Notes (Signed)
No new changes noted today 

## 2018-07-05 NOTE — Telephone Encounter (Signed)
Spoke with Dois Davenport, RN with [facility] PCP. Informed patient is starting on Vit B12 1000 Daily and will need a B12 check in one month. Also made aware of testing needed for possible bleeding diathesis that was mentioned in today's note. Informed of right thyroid enlargement and firmness on today's assessment and recommended thyroid ultrasound. Number provided should PCP have any questions regarding next steps. Informed office patient is to return to our clinic on PRN basis.

## 2018-07-05 NOTE — Telephone Encounter (Signed)
Contacted patient to inform her we will need to see her in 1 month for recheck of B12 levels and bleeding workup. Informed her 1week prior to appt. She will need to avoid all NSAIDS and ASA. Follow up with MD to review labs will be approx 9-10 days following the lab appt. Advised Zella Ball would reach out to schedule. Patient verbalizes understanding and denies any concerns at this time.

## 2018-08-01 ENCOUNTER — Telehealth: Payer: Self-pay

## 2018-08-01 ENCOUNTER — Other Ambulatory Visit: Payer: Self-pay

## 2018-08-01 NOTE — Telephone Encounter (Signed)
Everytime I call her it says her mailbox id full and cannot accept msgs at this time

## 2018-08-01 NOTE — Telephone Encounter (Signed)
Patient is scheduled to come in tomorrow for labs. However, she would like to reschedule since she has her son with her and can not leave him home alone or have someone care for him. Please call patient to reschedule. Thank you.

## 2018-08-02 ENCOUNTER — Inpatient Hospital Stay: Payer: Self-pay | Attending: Hematology and Oncology

## 2018-08-11 ENCOUNTER — Ambulatory Visit: Payer: Self-pay | Admitting: Hematology and Oncology

## 2021-01-20 ENCOUNTER — Other Ambulatory Visit: Payer: Self-pay

## 2021-03-06 ENCOUNTER — Other Ambulatory Visit: Payer: Self-pay

## 2021-03-06 ENCOUNTER — Other Ambulatory Visit: Payer: Self-pay | Admitting: Family Medicine

## 2021-03-06 ENCOUNTER — Ambulatory Visit
Admission: RE | Admit: 2021-03-06 | Discharge: 2021-03-06 | Disposition: A | Discharge status: Home / Self Care | Payer: Medicaid Other | Source: Ambulatory Visit | Attending: Family Medicine | Admitting: Family Medicine

## 2021-03-06 DIAGNOSIS — Z1231 Encounter for screening mammogram for malignant neoplasm of breast: Secondary | ICD-10-CM

## 2021-11-30 IMAGING — MG MM DIGITAL SCREENING BILAT W/ TOMO AND CAD
8 series · 8 of 24 positions shown · non-contrast
Comparison: Previous exam(s).

CLINICAL DATA: Screening.

EXAM:
DIGITAL SCREENING BILATERAL MAMMOGRAM WITH TOMOSYNTHESIS AND CAD
TECHNIQUE: Bilateral screening digital craniocaudal and mediolateral oblique
mammograms were obtained. Bilateral screening digital breast
tomosynthesis was performed. The images were evaluated with
computer-aided detection.

[L MLO synth-2D]
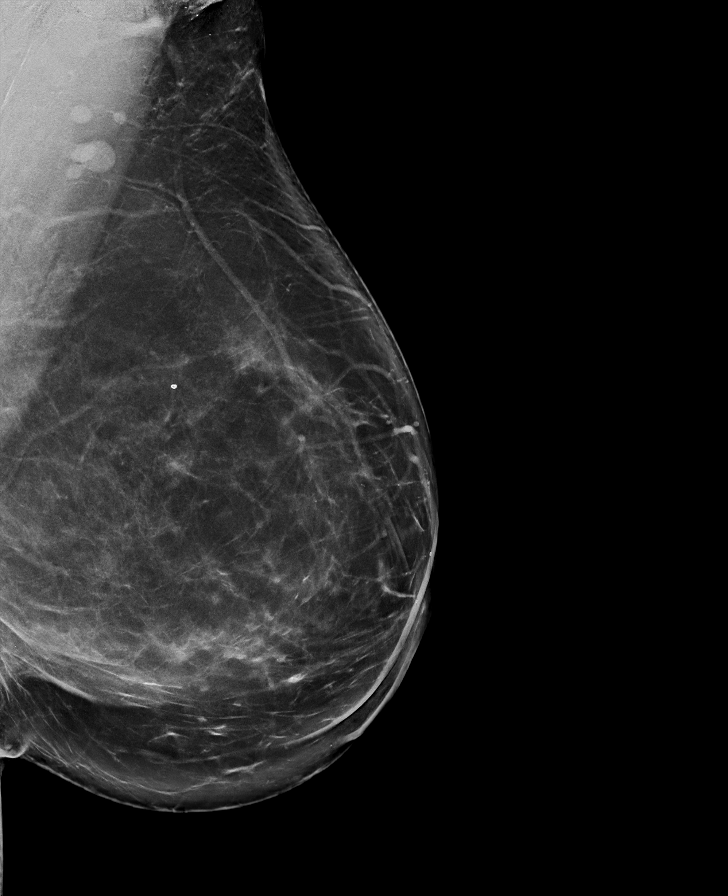

[R MLO synth-2D]
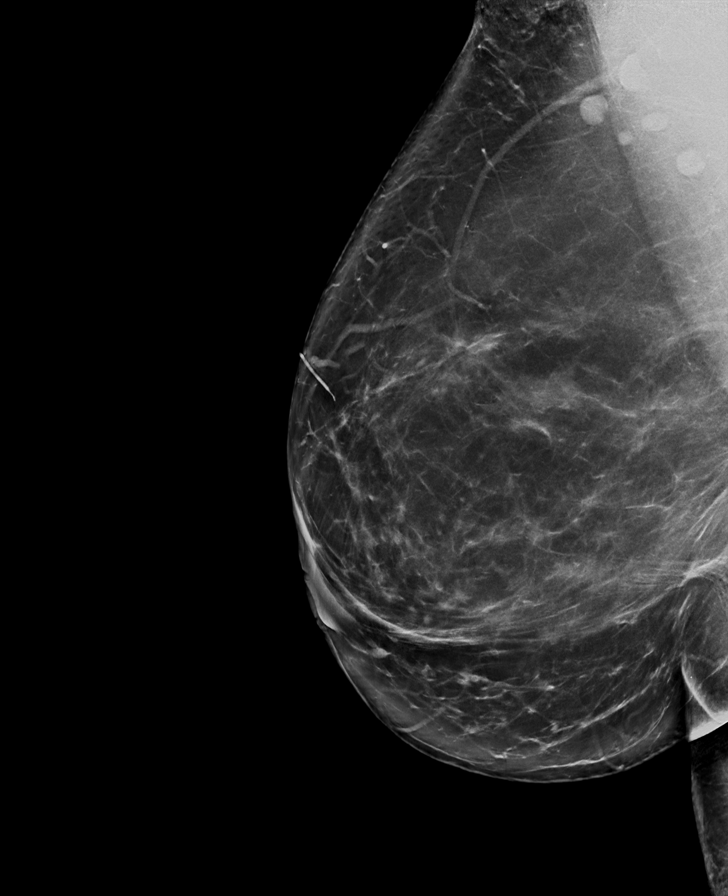

[R CC synth-2D]
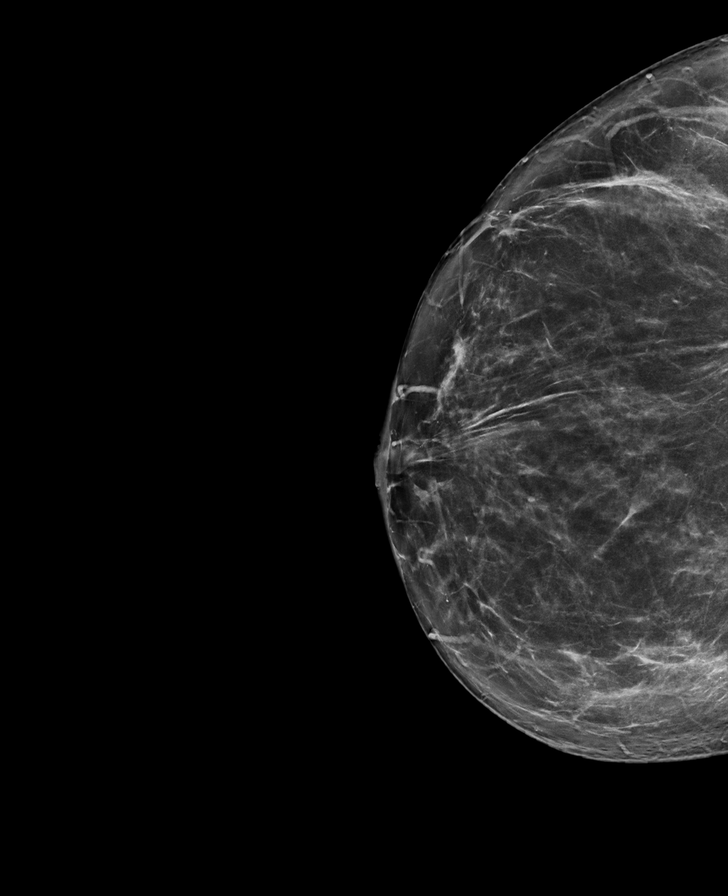

[L CC synth-2D]
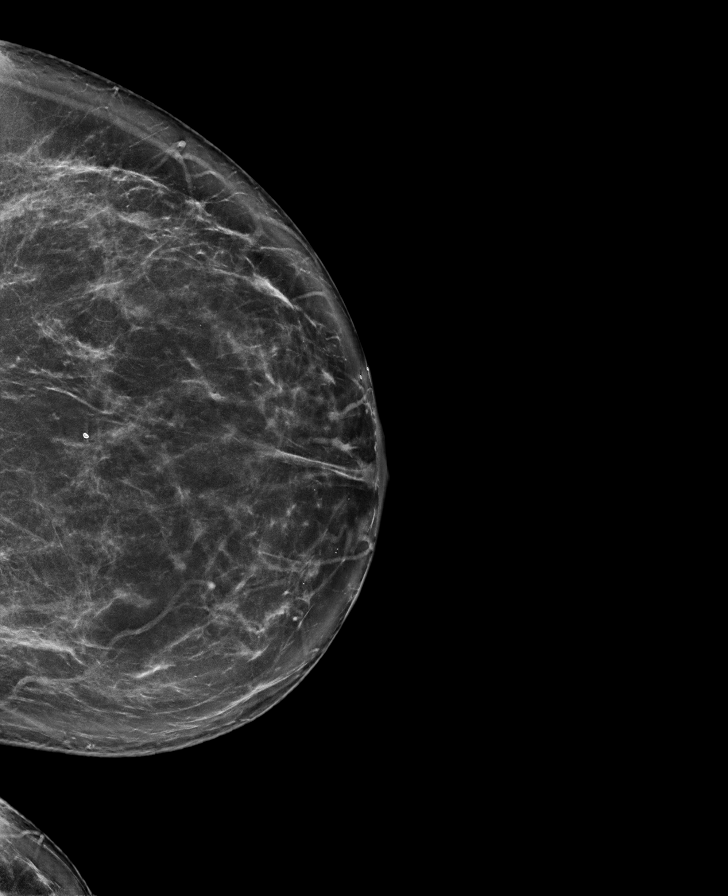

[L MLO tomo · tomo slice 52/103.0]
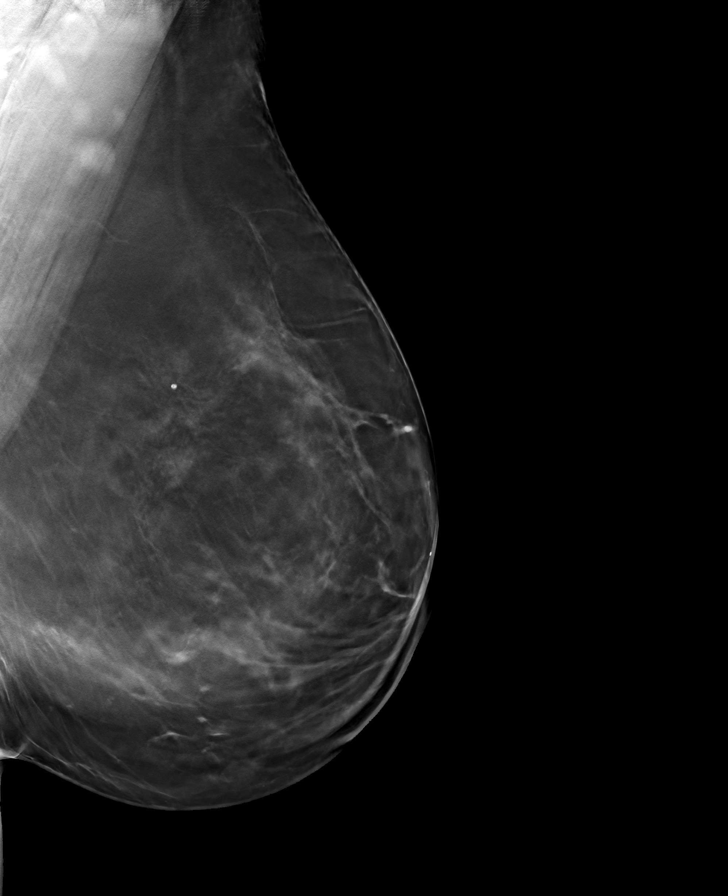

[R CC tomo · tomo slice 43/86.0]
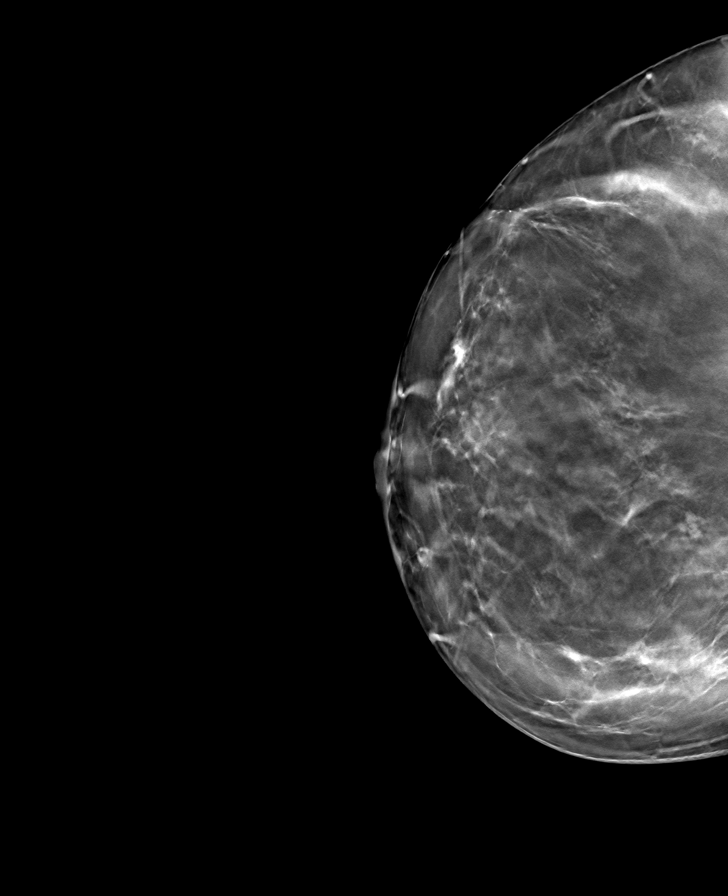

[R MLO tomo · tomo slice 50/99.0]
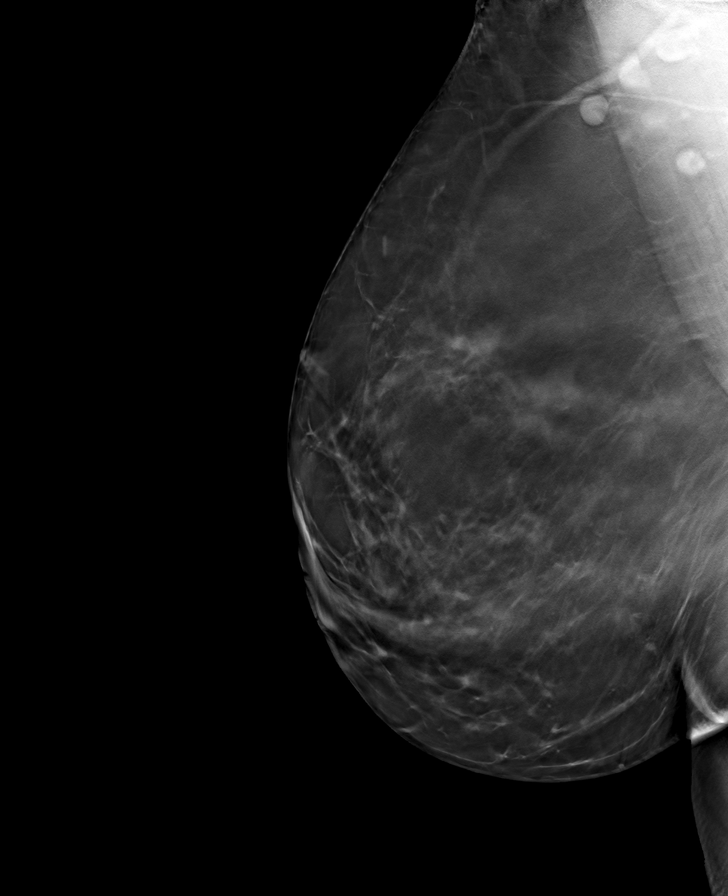

[L CC tomo · tomo slice 46/91.0]
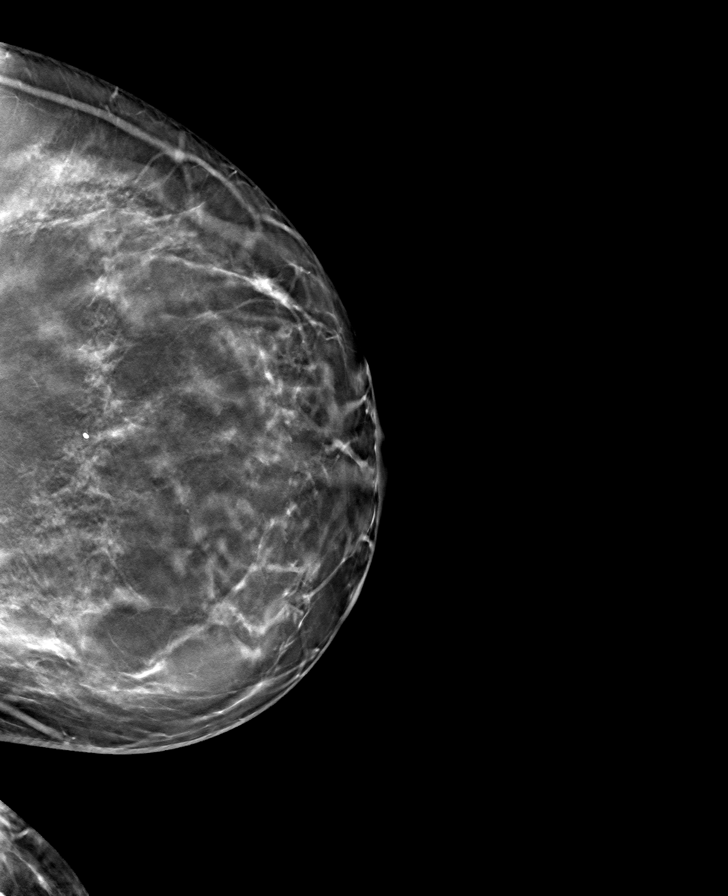

[8 of 24 positions shown; findings below may reference images not displayed]

ACR Breast Density Category b: There are scattered areas of
fibroglandular density.
FINDINGS: There are no findings suspicious for malignancy.
IMPRESSION: No mammographic evidence of malignancy. A result letter of this
screening mammogram will be mailed directly to the patient.

RECOMMENDATION:
Screening mammogram in one year. (Code:51-O-LD2)

BI-RADS CATEGORY  1: Negative.

## 2022-02-05 ENCOUNTER — Other Ambulatory Visit: Payer: Self-pay | Admitting: Family Medicine

## 2022-02-05 DIAGNOSIS — Z1231 Encounter for screening mammogram for malignant neoplasm of breast: Secondary | ICD-10-CM

## 2022-02-10 ENCOUNTER — Ambulatory Visit
Admission: RE | Admit: 2022-02-10 | Discharge: 2022-02-10 | Disposition: A | Discharge status: Home / Self Care | Payer: BC Managed Care – PPO | Source: Ambulatory Visit | Attending: Family Medicine | Admitting: Family Medicine

## 2022-02-10 DIAGNOSIS — Z1231 Encounter for screening mammogram for malignant neoplasm of breast: Secondary | ICD-10-CM
